# Patient Record
Sex: Female | Born: 1990 | Race: Black or African American | Hispanic: No | Marital: Single | Smoking: Never smoker
Health system: Southern US, Community
[De-identification: ages and names within clinical notes are randomized; demographics above are authoritative.]

## PROBLEM LIST (undated history)

## (undated) DIAGNOSIS — D649 Anemia, unspecified: Secondary | ICD-10-CM

## (undated) DIAGNOSIS — R51 Headache: Secondary | ICD-10-CM

## (undated) DIAGNOSIS — L309 Dermatitis, unspecified: Secondary | ICD-10-CM

## (undated) DIAGNOSIS — R519 Headache, unspecified: Secondary | ICD-10-CM

## (undated) DIAGNOSIS — B999 Unspecified infectious disease: Secondary | ICD-10-CM

## (undated) DIAGNOSIS — F419 Anxiety disorder, unspecified: Secondary | ICD-10-CM

## (undated) HISTORY — PX: MOUTH SURGERY: SHX715

---
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
## 2009-05-29 IMAGING — CT CT PELVIS W/ CM
2 of 5 series · 17 of 46 positions shown, 19 images · IV contrast (agent unspecified)
Comparison: none

CT ABDOMEN

CLINICAL DATA: Abdominal pain, evaluate for appendicitis ovarian
symptoms, right lower quadrant pain  3 weeks

CT ABDOMEN AND PELVIS WITH CONTRAST
TECHNIQUE: Multidetector CT imaging of the abdomen and pelvis was
performed using the standard protocol following bolus
administration of intravenous contrast.
Contrast: 100 ml  Omni 300

[Series 2: abd_pel 5.0 b40s · axial · 0.55mm/px · z∈[-410,-60]mm · 14 of 80 slices shown, 16 images]
[im 5/80  soft-tissue]
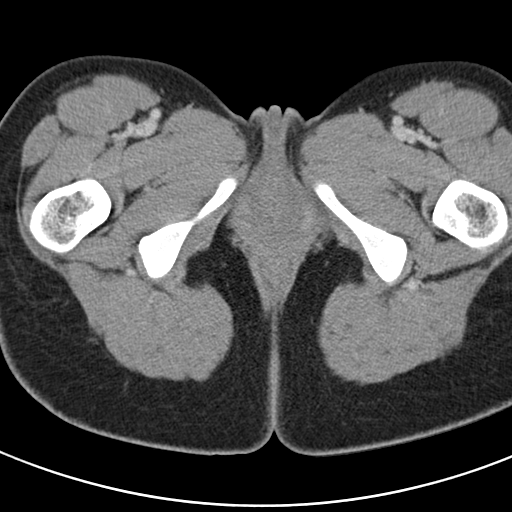
[im 5/80  bone]
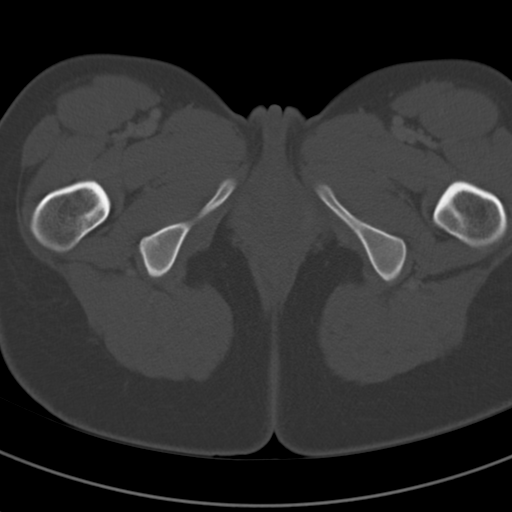
[im 9/80  soft-tissue]
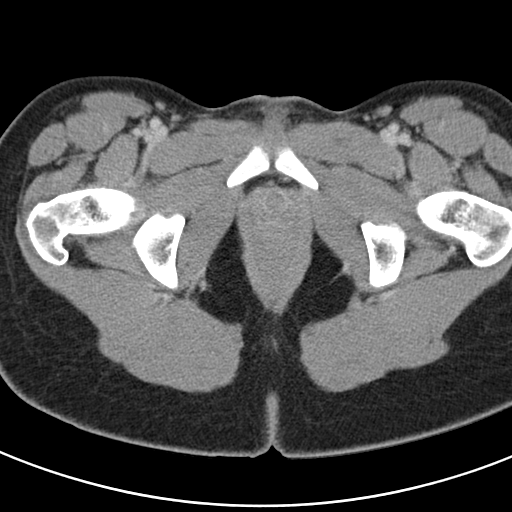
[im 17/80  soft-tissue]
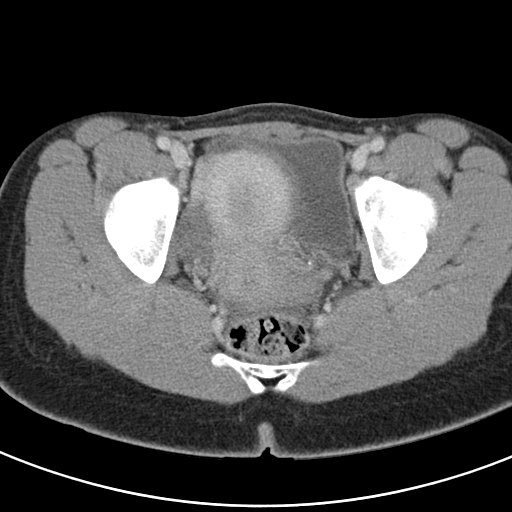
[im 21/80  soft-tissue]
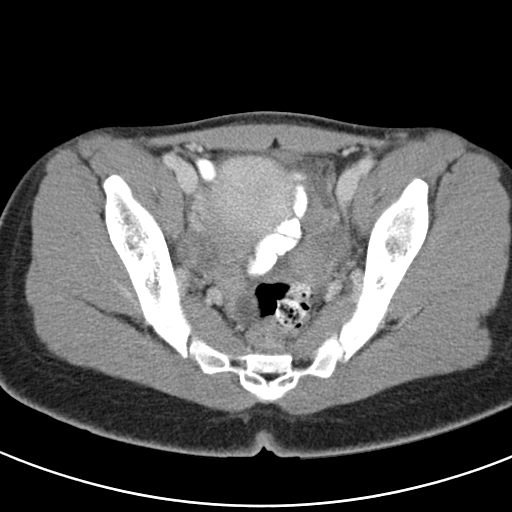
[im 25/80  soft-tissue]
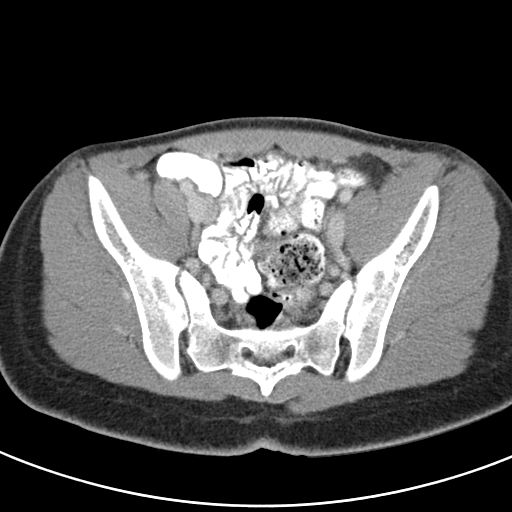
[im 34/80  soft-tissue]
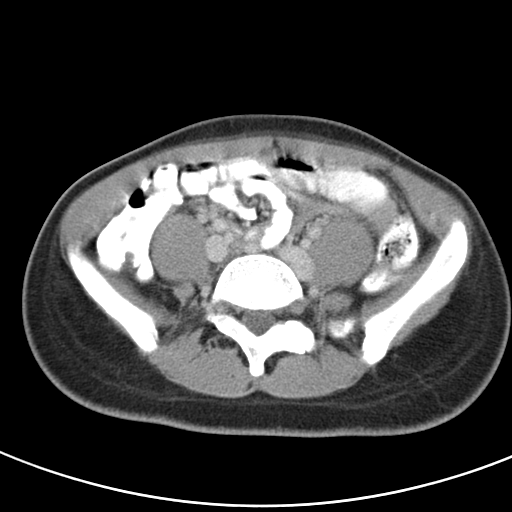
[im 38/80  soft-tissue]
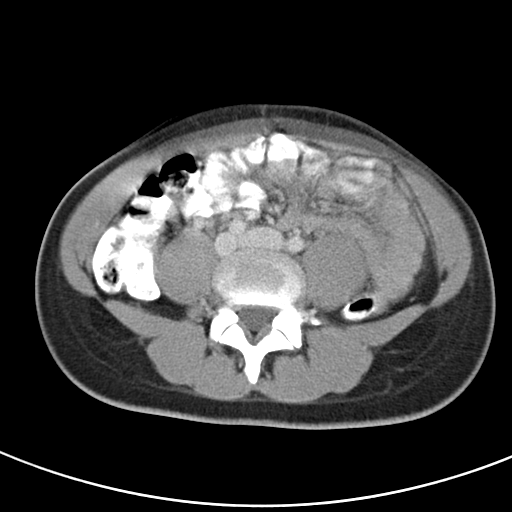
[im 42/80  soft-tissue]
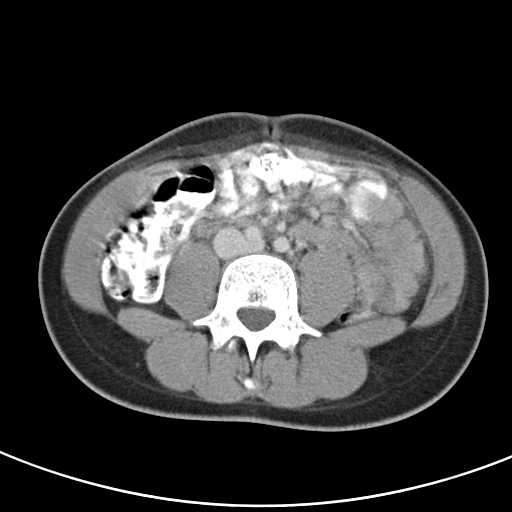
[im 46/80  soft-tissue]
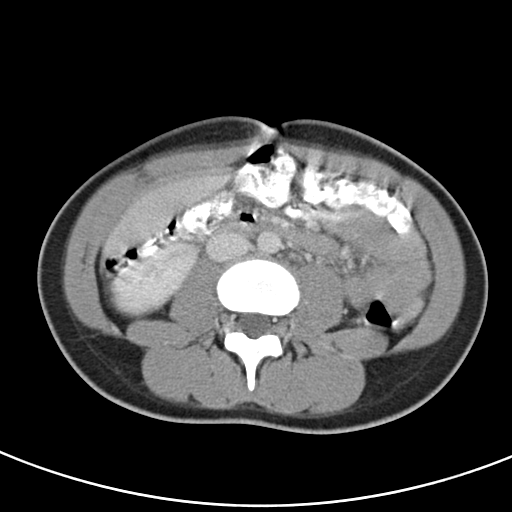
[im 46/80  bone]
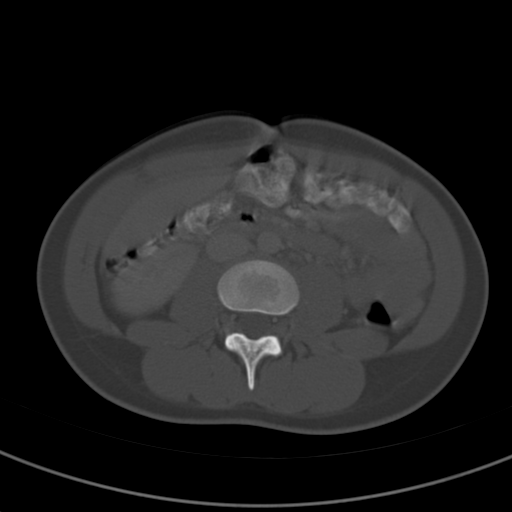
[im 55/80  soft-tissue]
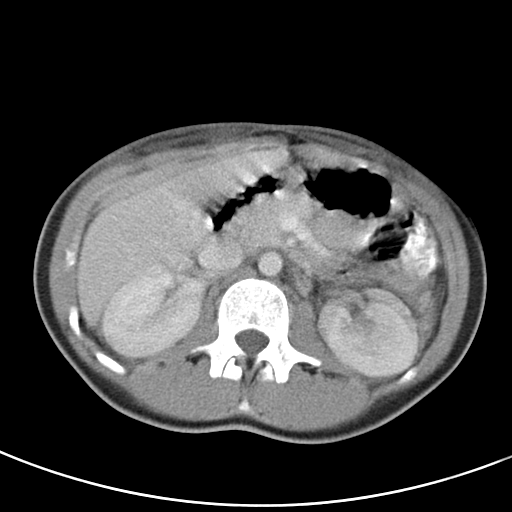
[im 59/80  soft-tissue]
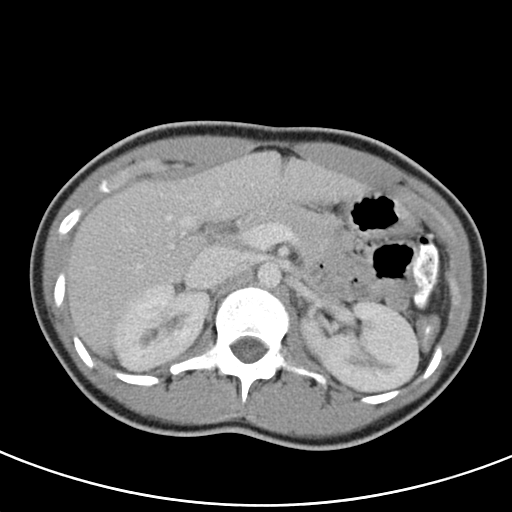
[im 63/80  soft-tissue]
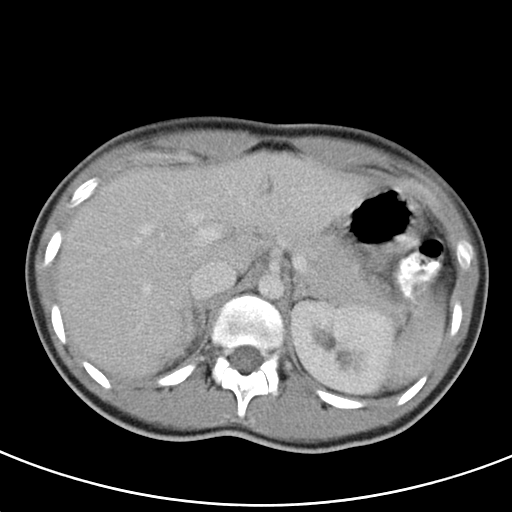
[im 71/80  soft-tissue]
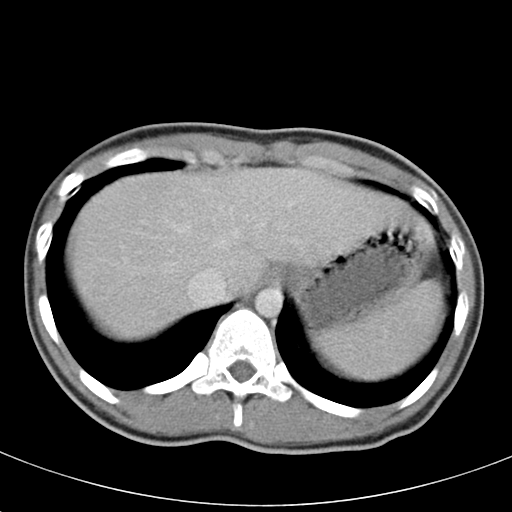
[im 75/80  soft-tissue]
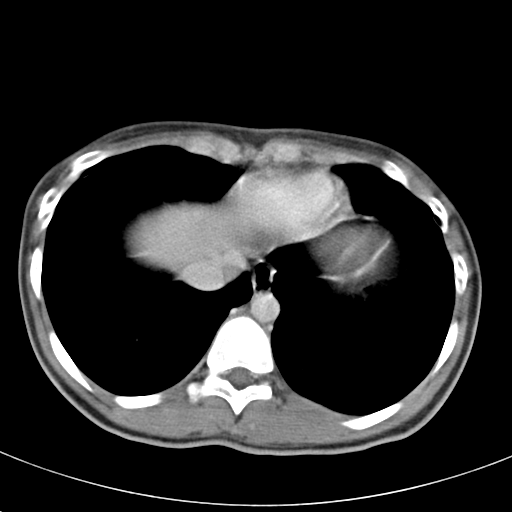

[Series 602: <mpr thick range> · coronal · 0.77mm/px · 3 of 59 slices shown]
[im 20/59  soft-tissue]
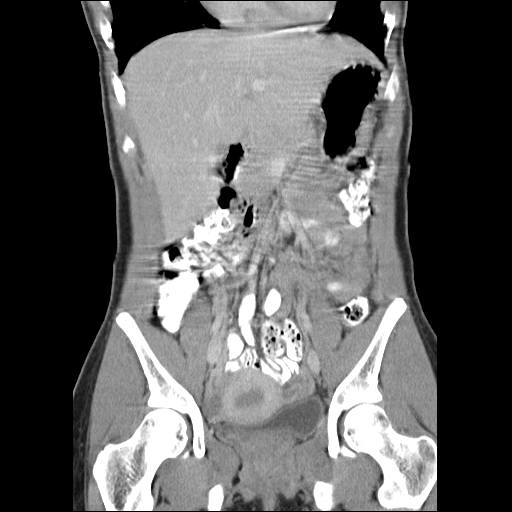
[im 26/59  soft-tissue]
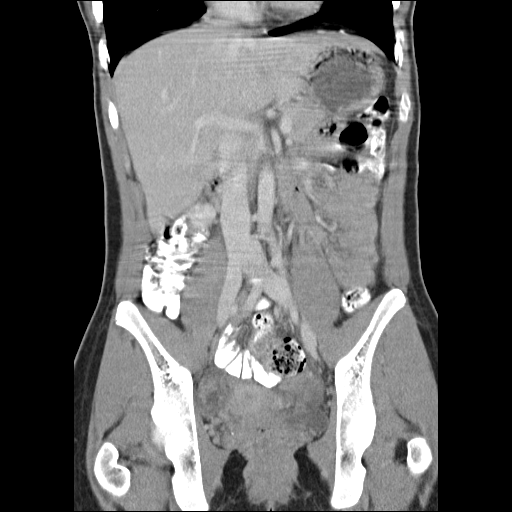
[im 33/59  soft-tissue]
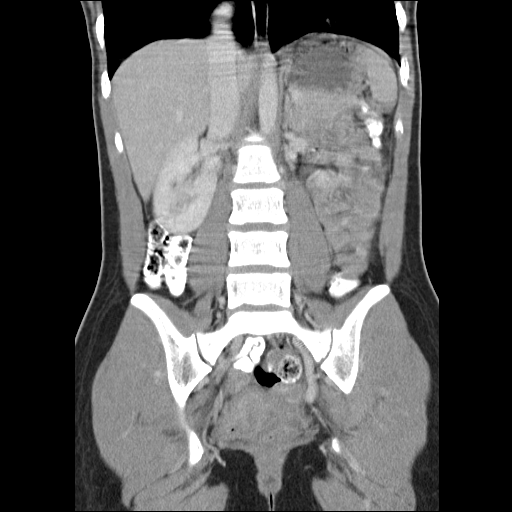

[17 of 46 positions shown; findings below may reference images not displayed]

FINDINGS: Lung bases are clear.  Heart is normal.

Motion artifact on scan.  No focal hepatic lesion.  The gallbladder
is collapsed.  The pancreas, spleen, adrenal glands, and kidneys
appear normal.

The stomach, small bowel, appendix, and colon appear normal.
IMPRESSION: 1..  No acute abdominal process.

CT PELVIS
FINDINGS: There is small amount of dependent free fluid in the
posterior cul-de-sac.  Bladder appears normal.  The endometrium is
thickened/filled with relative low density material.  There is a
ring enhancing structure in the right adnexa measuring approximate
16 mm and likely representing a involuting right ovarian cyst.  The
right ovary measures approximately 28 mm in maximum length.  The
left ovary measures proximally to 3.5  cm.

No evidence of pelvic lymphadenopathy.  No organized fluid
collections.

The rectum and sigmoid colon appear normal.

Review bone windows demonstrates no aggressive osseous lesions.
Unilateral pars defect on the right at S1.
IMPRESSION: 1..  Relative low density material expanding endometrial canal
likely related to menses.
2..  Ring enhancing structure in the right ovary likely
representing involuting ovarian cysts  / follicle.

3.  Unilateral pars defect on the right at S1.  No evidence of
spondylolisthesis.

## 2009-08-23 ENCOUNTER — Emergency Department (HOSPITAL_COMMUNITY): Admission: EM | Admit: 2009-08-23 | Discharge: 2009-08-23 | Payer: Self-pay | Admitting: Emergency Medicine

## 2009-08-25 ENCOUNTER — Emergency Department (HOSPITAL_COMMUNITY): Admission: EM | Admit: 2009-08-25 | Discharge: 2009-08-25 | Payer: Self-pay | Admitting: Emergency Medicine

## 2011-02-22 ENCOUNTER — Inpatient Hospital Stay (INDEPENDENT_AMBULATORY_CARE_PROVIDER_SITE_OTHER)
Admission: RE | Admit: 2011-02-22 | Discharge: 2011-02-22 | Disposition: A | Discharge status: Home / Self Care | Payer: Self-pay | Source: Ambulatory Visit | Attending: Family Medicine | Admitting: Family Medicine

## 2011-02-22 DIAGNOSIS — Z3201 Encounter for pregnancy test, result positive: Secondary | ICD-10-CM

## 2011-02-22 LAB — POCT PREGNANCY, URINE: Preg Test, Ur: POSITIVE

## 2011-04-11 LAB — COMPREHENSIVE METABOLIC PANEL
ALT: 13
AST: 18
CO2: 21
Chloride: 106
GFR calc Af Amer: >60
GFR calc non Af Amer: >60
Sodium: 138
Total Bilirubin: 0.6

## 2011-04-11 LAB — GC/CHLAMYDIA PROBE AMP, GENITAL: GC Probe Amp, Genital: NEGATIVE

## 2011-04-11 LAB — DIFFERENTIAL
Basophils Absolute: 0.1
Basophils Relative: 0
Eosinophils Absolute: 0
Eosinophils Relative: 0

## 2011-04-11 LAB — URINALYSIS, ROUTINE W REFLEX MICROSCOPIC
Bilirubin Urine: NEGATIVE
Hgb urine dipstick: NEGATIVE
Ketones, ur: NEGATIVE
Specific Gravity, Urine: 1.037 — ABNORMAL HIGH
Urobilinogen, UA: 0.2

## 2011-04-11 LAB — URINE MICROSCOPIC-ADD ON

## 2011-04-11 LAB — CBC
MCV: 77 — ABNORMAL LOW
RBC: 4.21
WBC: 18.9 — ABNORMAL HIGH

## 2011-05-01 LAB — URINE CULTURE

## 2011-05-01 LAB — URINALYSIS, ROUTINE W REFLEX MICROSCOPIC
Glucose, UA: NEGATIVE
Protein, ur: NEGATIVE
pH: 6

## 2011-05-01 LAB — URINE MICROSCOPIC-ADD ON

## 2011-09-28 ENCOUNTER — Encounter (HOSPITAL_COMMUNITY): Payer: Self-pay | Admitting: Anesthesiology

## 2011-09-28 ENCOUNTER — Encounter (HOSPITAL_COMMUNITY)
Admission: AD | Disposition: A | Discharge status: Home / Self Care | Payer: Self-pay | Source: Ambulatory Visit | Attending: Obstetrics and Gynecology

## 2011-09-28 ENCOUNTER — Encounter (HOSPITAL_COMMUNITY): Payer: Self-pay | Admitting: *Deleted

## 2011-09-28 ENCOUNTER — Inpatient Hospital Stay (HOSPITAL_COMMUNITY)
Admission: AD | Admit: 2011-09-28 | Discharge: 2011-10-01 | DRG: 766 | Disposition: A | Discharge status: Home / Self Care | Payer: Medicaid Other | Source: Ambulatory Visit | Attending: Obstetrics and Gynecology | Admitting: Obstetrics and Gynecology

## 2011-09-28 ENCOUNTER — Inpatient Hospital Stay (HOSPITAL_COMMUNITY): Payer: Medicaid Other | Admitting: Anesthesiology

## 2011-09-28 DIAGNOSIS — Z22330 Carrier of Group B streptococcus: Secondary | ICD-10-CM

## 2011-09-28 DIAGNOSIS — O99892 Other specified diseases and conditions complicating childbirth: Secondary | ICD-10-CM | POA: Diagnosis present

## 2011-09-28 DIAGNOSIS — Z98891 History of uterine scar from previous surgery: Secondary | ICD-10-CM

## 2011-09-28 HISTORY — DX: Anxiety disorder, unspecified: F41.9

## 2011-09-28 LAB — CBC
HCT: 37.2 % (ref 36.0–46.0)
Hemoglobin: 12.6 g/dL (ref 12.0–15.0)
MCHC: 33.9 g/dL (ref 30.0–36.0)
RBC: 4.28 MIL/uL (ref 3.87–5.11)

## 2011-09-28 LAB — ANTIBODY SCREEN: Antibody Screen: NEGATIVE

## 2011-09-28 LAB — HEPATITIS B SURFACE ANTIGEN: Hepatitis B Surface Ag: NEGATIVE

## 2011-09-28 LAB — RUBELLA ANTIBODY, IGM: Rubella: IMMUNE

## 2011-09-28 LAB — RPR: RPR: NONREACTIVE

## 2011-09-28 LAB — GC/CHLAMYDIA PROBE AMP, GENITAL: Chlamydia: NEGATIVE

## 2011-09-28 SURGERY — Surgical Case
Anesthesia: Epidural | Site: Abdomen | Wound class: Clean Contaminated

## 2011-09-28 MED ORDER — ONDANSETRON HCL 4 MG/2ML IJ SOLN
INTRAMUSCULAR | Status: DC | PRN
  Administered 2011-09-28: 4 mg via INTRAVENOUS

## 2011-09-28 MED ORDER — IBUPROFEN 600 MG PO TABS
600.0000 mg | ORAL_TABLET | Freq: Four times a day (QID) | ORAL | Status: DC
  Administered 2011-09-29 – 2011-10-01 (×8): 600 mg via ORAL
  Filled 2011-09-28 (×8): qty 1

## 2011-09-28 MED ORDER — OXYTOCIN 10 UNIT/ML IJ SOLN
INTRAMUSCULAR | Status: AC
  Filled 2011-09-28: qty 4

## 2011-09-28 MED ORDER — NALBUPHINE HCL 10 MG/ML IJ SOLN
5.0000 mg | INTRAMUSCULAR | Status: DC | PRN
  Filled 2011-09-28: qty 1

## 2011-09-28 MED ORDER — PHENYLEPHRINE 40 MCG/ML (10ML) SYRINGE FOR IV PUSH (FOR BLOOD PRESSURE SUPPORT)
PREFILLED_SYRINGE | INTRAVENOUS | Status: AC
  Filled 2011-09-28: qty 10

## 2011-09-28 MED ORDER — PRENATAL MULTIVITAMIN CH
1.0000 | ORAL_TABLET | Freq: Every day | ORAL | Status: DC
  Administered 2011-09-29 – 2011-10-01 (×3): 1 via ORAL
  Filled 2011-09-28 (×3): qty 1

## 2011-09-28 MED ORDER — LIDOCAINE HCL (PF) 1 % IJ SOLN: 30.0000 mL | INTRAMUSCULAR | Status: DC | PRN

## 2011-09-28 MED ORDER — PHENYLEPHRINE HCL 10 MG/ML IJ SOLN
INTRAMUSCULAR | Status: DC | PRN
  Administered 2011-09-28 (×2): 80 ug via INTRAVENOUS
  Administered 2011-09-28: 40 ug via INTRAVENOUS

## 2011-09-28 MED ORDER — TETANUS-DIPHTH-ACELL PERTUSSIS 5-2.5-18.5 LF-MCG/0.5 IM SUSP: 0.5000 mL | Freq: Once | INTRAMUSCULAR | Status: DC

## 2011-09-28 MED ORDER — ZOLPIDEM TARTRATE 5 MG PO TABS: 5.0000 mg | ORAL_TABLET | Freq: Every evening | ORAL | Status: DC | PRN

## 2011-09-28 MED ORDER — OXYCODONE-ACETAMINOPHEN 5-325 MG PO TABS
1.0000 | ORAL_TABLET | ORAL | Status: DC | PRN
  Administered 2011-09-29 – 2011-10-01 (×5): 1 via ORAL
  Filled 2011-09-28 (×5): qty 1
  Filled 2011-09-28: qty 9

## 2011-09-28 MED ORDER — OXYTOCIN 20 UNITS IN LACTATED RINGERS INFUSION - SIMPLE
1.0000 m[IU]/min | INTRAVENOUS | Status: DC
  Administered 2011-09-28: 1 m[IU]/min via INTRAVENOUS

## 2011-09-28 MED ORDER — DIPHENHYDRAMINE HCL 50 MG/ML IJ SOLN: 12.5000 mg | INTRAMUSCULAR | Status: DC | PRN

## 2011-09-28 MED ORDER — OXYTOCIN BOLUS FROM INFUSION
500.0000 mL | Freq: Once | INTRAVENOUS | Status: DC
  Filled 2011-09-28: qty 500

## 2011-09-28 MED ORDER — CITRIC ACID-SODIUM CITRATE 334-500 MG/5ML PO SOLN
ORAL | Status: AC
  Administered 2011-09-28: 30 mL
  Filled 2011-09-28: qty 15

## 2011-09-28 MED ORDER — OXYTOCIN 20 UNITS IN LACTATED RINGERS INFUSION - SIMPLE
125.0000 mL/h | Freq: Once | INTRAVENOUS | Status: DC
  Filled 2011-09-28: qty 1000

## 2011-09-28 MED ORDER — SCOPOLAMINE 1 MG/3DAYS TD PT72: 1.0000 | MEDICATED_PATCH | Freq: Once | TRANSDERMAL | Status: DC

## 2011-09-28 MED ORDER — LACTATED RINGERS IV SOLN
INTRAVENOUS | Status: DC | PRN
  Administered 2011-09-28 (×2): via INTRAVENOUS

## 2011-09-28 MED ORDER — SIMETHICONE 80 MG PO CHEW
80.0000 mg | CHEWABLE_TABLET | Freq: Three times a day (TID) | ORAL | Status: DC
  Administered 2011-09-29 – 2011-10-01 (×9): 80 mg via ORAL

## 2011-09-28 MED ORDER — PENICILLIN G POTASSIUM 5000000 UNITS IJ SOLR
2.5000 10*6.[IU] | INTRAVENOUS | Status: DC
  Administered 2011-09-28 (×3): 2.5 10*6.[IU] via INTRAVENOUS
  Filled 2011-09-28 (×6): qty 2.5

## 2011-09-28 MED ORDER — KETOROLAC TROMETHAMINE 60 MG/2ML IM SOLN
60.0000 mg | Freq: Once | INTRAMUSCULAR | Status: DC | PRN
  Filled 2011-09-28: qty 2

## 2011-09-28 MED ORDER — SODIUM CHLORIDE 0.9 % IV SOLN
1.0000 ug/kg/h | INTRAVENOUS | Status: DC | PRN
  Filled 2011-09-28: qty 2.5

## 2011-09-28 MED ORDER — LACTATED RINGERS IV SOLN
INTRAVENOUS | Status: DC
  Administered 2011-09-28 (×3): via INTRAVENOUS

## 2011-09-28 MED ORDER — CEFAZOLIN SODIUM 1-5 GM-% IV SOLN
1.0000 g | Freq: Three times a day (TID) | INTRAVENOUS | Status: AC
  Administered 2011-09-29 (×2): 1 g via INTRAVENOUS
  Filled 2011-09-28 (×2): qty 50

## 2011-09-28 MED ORDER — OXYCODONE-ACETAMINOPHEN 5-325 MG PO TABS: 1.0000 | ORAL_TABLET | ORAL | Status: DC | PRN

## 2011-09-28 MED ORDER — BUTORPHANOL TARTRATE 2 MG/ML IJ SOLN
INTRAMUSCULAR | Status: AC
  Filled 2011-09-28: qty 1

## 2011-09-28 MED ORDER — CEFAZOLIN SODIUM 1-5 GM-% IV SOLN
INTRAVENOUS | Status: DC | PRN
  Administered 2011-09-28: 2 g via INTRAVENOUS

## 2011-09-28 MED ORDER — DIPHENHYDRAMINE HCL 25 MG PO CAPS
25.0000 mg | ORAL_CAPSULE | ORAL | Status: DC | PRN
  Filled 2011-09-28: qty 1

## 2011-09-28 MED ORDER — SODIUM BICARBONATE 8.4 % IV SOLN
INTRAVENOUS | Status: DC | PRN
  Administered 2011-09-28: 5 mL via EPIDURAL

## 2011-09-28 MED ORDER — IBUPROFEN 600 MG PO TABS: 600.0000 mg | ORAL_TABLET | Freq: Four times a day (QID) | ORAL | Status: DC | PRN

## 2011-09-28 MED ORDER — BUTORPHANOL TARTRATE 2 MG/ML IJ SOLN
1.0000 mg | Freq: Once | INTRAMUSCULAR | Status: AC
  Administered 2011-09-28: 09:00:00 via INTRAVENOUS
  Filled 2011-09-28: qty 1

## 2011-09-28 MED ORDER — CEFAZOLIN SODIUM-DEXTROSE 2-3 GM-% IV SOLR
2.0000 g | Freq: Once | INTRAVENOUS | Status: DC
  Filled 2011-09-28: qty 50

## 2011-09-28 MED ORDER — MEPERIDINE HCL 25 MG/ML IJ SOLN
INTRAMUSCULAR | Status: DC | PRN
  Administered 2011-09-28: 25 mg via INTRAVENOUS

## 2011-09-28 MED ORDER — PHENYLEPHRINE 40 MCG/ML (10ML) SYRINGE FOR IV PUSH (FOR BLOOD PRESSURE SUPPORT): 80.0000 ug | PREFILLED_SYRINGE | INTRAVENOUS | Status: DC | PRN

## 2011-09-28 MED ORDER — ONDANSETRON HCL 4 MG PO TABS: 4.0000 mg | ORAL_TABLET | ORAL | Status: DC | PRN

## 2011-09-28 MED ORDER — MORPHINE SULFATE (PF) 0.5 MG/ML IJ SOLN
INTRAMUSCULAR | Status: DC | PRN
  Administered 2011-09-28: 1 mg via INTRAVENOUS

## 2011-09-28 MED ORDER — MORPHINE SULFATE (PF) 0.5 MG/ML IJ SOLN
INTRAMUSCULAR | Status: DC | PRN
  Administered 2011-09-28: 4 mg via EPIDURAL

## 2011-09-28 MED ORDER — LIDOCAINE HCL (PF) 1 % IJ SOLN
INTRAMUSCULAR | Status: DC | PRN
  Administered 2011-09-28 (×3): 4 mL

## 2011-09-28 MED ORDER — MENTHOL 3 MG MT LOZG: 1.0000 | LOZENGE | OROMUCOSAL | Status: DC | PRN

## 2011-09-28 MED ORDER — DIPHENHYDRAMINE HCL 25 MG PO CAPS: 25.0000 mg | ORAL_CAPSULE | Freq: Four times a day (QID) | ORAL | Status: DC | PRN

## 2011-09-28 MED ORDER — SODIUM CHLORIDE 0.9 % IR SOLN
Status: DC | PRN
  Administered 2011-09-28: 1000 mL

## 2011-09-28 MED ORDER — FENTANYL CITRATE 0.05 MG/ML IJ SOLN
INTRAMUSCULAR | Status: AC
  Filled 2011-09-28: qty 2

## 2011-09-28 MED ORDER — BUTORPHANOL TARTRATE 2 MG/ML IJ SOLN
1.0000 mg | Freq: Once | INTRAMUSCULAR | Status: AC
  Administered 2011-09-28: 1 mg via INTRAVENOUS

## 2011-09-28 MED ORDER — OXYTOCIN 10 UNIT/ML IJ SOLN
20.0000 [IU] | INTRAVENOUS | Status: DC | PRN
  Administered 2011-09-28: 20 [IU] via INTRAVENOUS

## 2011-09-28 MED ORDER — PENICILLIN G POTASSIUM 5000000 UNITS IJ SOLR
5.0000 10*6.[IU] | Freq: Once | INTRAVENOUS | Status: AC
  Administered 2011-09-28: 5 10*6.[IU] via INTRAVENOUS
  Filled 2011-09-28: qty 5

## 2011-09-28 MED ORDER — OXYTOCIN 20 UNITS IN LACTATED RINGERS INFUSION - SIMPLE
INTRAVENOUS | Status: AC
  Administered 2011-09-28: 20 [IU]
  Filled 2011-09-28: qty 1000

## 2011-09-28 MED ORDER — FENTANYL CITRATE 0.05 MG/ML IJ SOLN: 25.0000 ug | INTRAMUSCULAR | Status: DC | PRN

## 2011-09-28 MED ORDER — PHENYLEPHRINE 40 MCG/ML (10ML) SYRINGE FOR IV PUSH (FOR BLOOD PRESSURE SUPPORT)
80.0000 ug | PREFILLED_SYRINGE | INTRAVENOUS | Status: DC | PRN
  Filled 2011-09-28: qty 5

## 2011-09-28 MED ORDER — DIPHENHYDRAMINE HCL 50 MG/ML IJ SOLN: 25.0000 mg | INTRAMUSCULAR | Status: DC | PRN

## 2011-09-28 MED ORDER — MEASLES, MUMPS & RUBELLA VAC ~~LOC~~ INJ
0.5000 mL | INJECTION | Freq: Once | SUBCUTANEOUS | Status: DC
Start: 2011-09-29 — End: 2011-09-29
  Filled 2011-09-28: qty 0.5

## 2011-09-28 MED ORDER — LACTATED RINGERS IV SOLN: 500.0000 mL | Freq: Once | INTRAVENOUS | Status: DC

## 2011-09-28 MED ORDER — NALOXONE HCL 0.4 MG/ML IJ SOLN: 0.4000 mg | INTRAMUSCULAR | Status: DC | PRN

## 2011-09-28 MED ORDER — EPHEDRINE 5 MG/ML INJ
10.0000 mg | INTRAVENOUS | Status: DC | PRN
  Filled 2011-09-28: qty 4

## 2011-09-28 MED ORDER — SENNOSIDES-DOCUSATE SODIUM 8.6-50 MG PO TABS
2.0000 | ORAL_TABLET | Freq: Every day | ORAL | Status: DC
  Administered 2011-09-29: 2 via ORAL

## 2011-09-28 MED ORDER — ONDANSETRON HCL 4 MG/2ML IJ SOLN: 4.0000 mg | Freq: Three times a day (TID) | INTRAMUSCULAR | Status: DC | PRN

## 2011-09-28 MED ORDER — KETOROLAC TROMETHAMINE 30 MG/ML IJ SOLN: 30.0000 mg | Freq: Four times a day (QID) | INTRAMUSCULAR | Status: DC | PRN

## 2011-09-28 MED ORDER — OXYTOCIN 20 UNITS IN LACTATED RINGERS INFUSION - SIMPLE: 125.0000 mL/h | INTRAVENOUS | Status: AC

## 2011-09-28 MED ORDER — ONDANSETRON HCL 4 MG/2ML IJ SOLN: 4.0000 mg | INTRAMUSCULAR | Status: DC | PRN

## 2011-09-28 MED ORDER — MEPERIDINE HCL 25 MG/ML IJ SOLN: 6.2500 mg | INTRAMUSCULAR | Status: DC | PRN

## 2011-09-28 MED ORDER — FENTANYL CITRATE 0.05 MG/ML IJ SOLN
INTRAMUSCULAR | Status: DC | PRN
  Administered 2011-09-28 (×2): 50 ug via INTRAVENOUS

## 2011-09-28 MED ORDER — ONDANSETRON HCL 4 MG/2ML IJ SOLN
INTRAMUSCULAR | Status: AC
  Filled 2011-09-28: qty 2

## 2011-09-28 MED ORDER — DIBUCAINE 1 % RE OINT: 1.0000 "application " | TOPICAL_OINTMENT | RECTAL | Status: DC | PRN

## 2011-09-28 MED ORDER — SIMETHICONE 80 MG PO CHEW: 80.0000 mg | CHEWABLE_TABLET | ORAL | Status: DC | PRN

## 2011-09-28 MED ORDER — MEPERIDINE HCL 25 MG/ML IJ SOLN
INTRAMUSCULAR | Status: AC
  Filled 2011-09-28: qty 1

## 2011-09-28 MED ORDER — EPHEDRINE 5 MG/ML INJ: 10.0000 mg | INTRAVENOUS | Status: DC | PRN

## 2011-09-28 MED ORDER — WITCH HAZEL-GLYCERIN EX PADS: 1.0000 "application " | MEDICATED_PAD | CUTANEOUS | Status: DC | PRN

## 2011-09-28 MED ORDER — SODIUM CHLORIDE 0.9 % IJ SOLN: 3.0000 mL | INTRAMUSCULAR | Status: DC | PRN

## 2011-09-28 MED ORDER — FENTANYL 2.5 MCG/ML BUPIVACAINE 1/10 % EPIDURAL INFUSION (WH - ANES)
14.0000 mL/h | INTRAMUSCULAR | Status: DC
  Administered 2011-09-28 (×2): 14 mL/h via EPIDURAL
  Filled 2011-09-28 (×2): qty 60

## 2011-09-28 MED ORDER — LANOLIN HYDROUS EX OINT: 1.0000 "application " | TOPICAL_OINTMENT | CUTANEOUS | Status: DC | PRN

## 2011-09-28 MED ORDER — KETOROLAC TROMETHAMINE 30 MG/ML IJ SOLN
30.0000 mg | Freq: Four times a day (QID) | INTRAMUSCULAR | Status: AC | PRN
  Administered 2011-09-28: 30 mg via INTRAVENOUS
  Filled 2011-09-28: qty 1

## 2011-09-28 MED ORDER — METOCLOPRAMIDE HCL 5 MG/ML IJ SOLN: 10.0000 mg | Freq: Three times a day (TID) | INTRAMUSCULAR | Status: DC | PRN

## 2011-09-28 MED ORDER — LACTATED RINGERS IV SOLN
500.0000 mL | INTRAVENOUS | Status: DC | PRN
  Administered 2011-09-28 (×2): 1000 mL via INTRAVENOUS

## 2011-09-28 MED ORDER — MORPHINE SULFATE 0.5 MG/ML IJ SOLN
INTRAMUSCULAR | Status: AC
  Filled 2011-09-28: qty 10

## 2011-09-28 SURGICAL SUPPLY — 31 items
CLOTH BEACON ORANGE TIMEOUT ST (SAFETY) ×2 IMPLANT
CONTAINER PREFILL 10% NBF 15ML (MISCELLANEOUS) IMPLANT
DRESSING TELFA 8X3 (GAUZE/BANDAGES/DRESSINGS) IMPLANT
DRSG VASELINE 3X18 (GAUZE/BANDAGES/DRESSINGS) ×2 IMPLANT
ELECT REM PT RETURN 9FT ADLT (ELECTROSURGICAL) ×2
ELECTRODE REM PT RTRN 9FT ADLT (ELECTROSURGICAL) ×1 IMPLANT
EXTRACTOR VACUUM KIWI (MISCELLANEOUS) IMPLANT
EXTRACTOR VACUUM M CUP 4 TUBE (SUCTIONS) IMPLANT
GAUZE SPONGE 4X4 12PLY STRL LF (GAUZE/BANDAGES/DRESSINGS) ×2 IMPLANT
GLOVE BIO SURGEON STRL SZ7.5 (GLOVE) ×4 IMPLANT
GOWN PREVENTION PLUS LG XLONG (DISPOSABLE) ×4 IMPLANT
GOWN PREVENTION PLUS XLARGE (GOWN DISPOSABLE) ×2 IMPLANT
KIT ABG SYR 3ML LUER SLIP (SYRINGE) IMPLANT
NEEDLE HYPO 25X5/8 SAFETYGLIDE (NEEDLE) IMPLANT
NS IRRIG 1000ML POUR BTL (IV SOLUTION) ×2 IMPLANT
PACK C SECTION WH (CUSTOM PROCEDURE TRAY) ×2 IMPLANT
PAD ABD 7.5X8 STRL (GAUZE/BANDAGES/DRESSINGS) IMPLANT
RTRCTR C-SECT PINK 25CM LRG (MISCELLANEOUS) IMPLANT
SLEEVE SCD COMPRESS KNEE MED (MISCELLANEOUS) IMPLANT
STAPLER VISISTAT 35W (STAPLE) ×2 IMPLANT
SUT PLAIN 0 NONE (SUTURE) IMPLANT
SUT VIC AB 0 CT1 36 (SUTURE) ×10 IMPLANT
SUT VIC AB 3-0 CTX 36 (SUTURE) ×2 IMPLANT
SUT VIC AB 3-0 SH 27 (SUTURE)
SUT VIC AB 3-0 SH 27X BRD (SUTURE) IMPLANT
SUT VIC AB 4-0 KS 27 (SUTURE) IMPLANT
SUT VICRYL 0 TIES 12 18 (SUTURE) IMPLANT
TAPE CLOTH SURG 4X10 WHT LF (GAUZE/BANDAGES/DRESSINGS) ×2 IMPLANT
TOWEL OR 17X24 6PK STRL BLUE (TOWEL DISPOSABLE) ×4 IMPLANT
TRAY FOLEY CATH 14FR (SET/KITS/TRAYS/PACK) IMPLANT
WATER STERILE IRR 1000ML POUR (IV SOLUTION) ×2 IMPLANT

## 2011-09-28 NOTE — Progress Notes (Signed)
Patient ID: Shelley Merritt, female   DOB: 1991-04-01, 21 y.o.   MRN: [id number] The pt tolerates labor poorly. She is on 20 mu/ minute of pitocin and the contractions are q 2-4and 1/2 minutes Cervix is 3-4 cm 70% effaced and the vertex is at -2 station. Will proceed with epidural.

## 2011-09-28 NOTE — Transfer of Care (Signed)
Immediate Anesthesia Transfer of Care Note  Patient: Shelley Merritt  Procedure(s) Performed: Procedure(s) (LRB): CESAREAN SECTION (N/A)  Patient Location: PACU  Anesthesia Type: Epidural  Level of Consciousness: awake, alert  and oriented  Airway & Oxygen Therapy: Patient Spontanous Breathing  Post-op Assessment: Report given to PACU RN and Post -op Vital signs reviewed and stable  Post vital signs: stable  Complications: No apparent anesthesia complications

## 2011-09-28 NOTE — Consult Note (Addendum)
Requested to attend primary C/S at term gestation for failure to dilate after trial of labor induction. No risk factors except + maternal GBS but she has received adequate penicillin prophylaxis.  Mother also has anxiety syndrome.  At birth infant in vertex with spontaneous cries and good tone following delivery. Given tactile stimulation with drying and bulb suction of naso/oropharynx yielding minimal fluid. There are no dysmorphic features. Apgar scores 9/10 at one and five minutes.    Care deferred to RN in OR and to assigned pediatrician.   Dagoberto Ligas MD [facility]

## 2011-09-28 NOTE — MAU Note (Signed)
Pt walked into MAU lobby and very excited and anxious. Pants obviously wet and states her water broke. Pt has anxiety and very anxious. Helped to w/c and then to triage. Helped her to breathe slowly and relax and calm down. Triage done and pt helped to BR and put on dry clothes. Had to remind pt to stay calm and relax.

## 2011-09-28 NOTE — MAU Note (Signed)
Report called to [facility] in [place]. Pt to 169 via w/c.

## 2011-09-28 NOTE — Plan of Care (Signed)
Problem: Consults Goal: Birthing Suites Patient Information Press F2 to bring up selections list   Pt 37-[redacted] weeks EGA     

## 2011-09-28 NOTE — Op Note (Signed)
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 Rodman Pickle  There was hematuria present before the patient left her labor room. She was transported to the operating room and anesthesia was confirmed by Dr. Rodman Pickle. The abdomen was prepped with Betadine solution, a timeout was done, and the abdomen was draped as a sterile field. I pinched the lower abdomen with an Allis clamp and the patient had no pain. A transverse incision was made suprapubically and carried in layers through the skin subcutaneous tissue and fascia. The fascia was separated from the rectus muscle superiorly and inferiorly. The rectus muscle was split in the midline and the peritoneum was opened vertically. The lower uterine segment was exposed and a transverse incision was made through the superficial layers of the myometrium. I went into the amniotic sac with my finger and enlarged the incision by pulling superiorly and inferiorly. A large loop of cord popped out through the incision. I elevated the vertex into the uterine incision and with fundal pressure delivered the baby. The cord was clamped and the infant was given to Dr. Rennis Golden  who was in attendance. He assigned the female infant Apgars of 9 and 10 at one and 5 minutes. The placenta was removed intact and the inside of the uterus was free of any products of conception. The uterus both tubes and ovaries were normal. The uterine incision was closed with 2 running sutures of 0 Vicryl locking the first layer nonlocking the second layer Liberal irrigation confirmed hemostasis. A bleeder at the top of the peritoneal incision going in was controlled at this point. The abdominal wall was closed in layers using interrupted sutures of 0 Vicryl to reapproximate the rectus muscle and peritoneum in one  layer, 2 running sutures of 0 Vicryl on the fascia, a running 3-0 Vicryl on the subcutaneous tissue, and staples on the skin. The patient tolerated the procedure but she did have discomfort during the procedure. She is a very anxious individual and was returned to recovery in satisfactory condition sponge and needle counts were correct

## 2011-09-28 NOTE — Anesthesia Procedure Notes (Signed)
Epidural Patient location during procedure: OB Start time: 09/28/2011 1:27 PM Reason for block: procedure for pain  Staffing Performed by: anesthesiologist   Preanesthetic Checklist Completed: patient identified, site marked, surgical consent, pre-op evaluation, timeout performed, IV checked, risks and benefits discussed and monitors and equipment checked  Epidural Patient position: sitting Prep: site prepped and draped and DuraPrep Patient monitoring: continuous pulse ox and blood pressure Approach: midline Injection technique: LOR air  Needle:  Needle type: Tuohy  Needle gauge: 17 G Needle length: 9 cm Needle insertion depth: 5 cm cm Catheter type: closed end flexible Catheter size: 19 Gauge Catheter at skin depth: 10 cm Test dose: negative  Assessment Events: blood not aspirated, injection not painful, no injection resistance, negative IV test and no paresthesia  Additional Notes Discussed risk of headache, infection, bleeding, nerve injury and failed or incomplete block.  Patient voices understanding and wishes to proceed.

## 2011-09-28 NOTE — Progress Notes (Signed)
Patient ID: Shelley Merritt, female   DOB: Apr 16, 1991, 21 y.o.   MRN: [id number] Pt is on 26 mu/minute of pitocin and contractions are q 2 minutes Cervix is 4 cm 70% effaced and the vertex is at -1 station I will proceed with Csection for FTP

## 2011-09-28 NOTE — H&P (Signed)
Shelley, Merritt              ACCOUNT NO.:  [id number]  MEDICAL RECORD NO.:  [id number]  LOCATION:  9169                          FACILITY:  [facility]  PHYSICIAN:  Malachi Pro. Ambrose Mantle, M.D. DATE OF BIRTH:  Jun 09, 1991  DATE OF ADMISSION:  09/28/2011 DATE OF DISCHARGE:                             HISTORY & PHYSICAL   HISTORY OF PRESENT ILLNESS:  This is a 21 year old black female, para 0, gravida 1, EDC, October 05, 2011, admitted with ruptured membranes.  The patient states that she was drinking lemonade at approximately 1:30 a.m. on September 28, 2011, and began leaking fluid through her vagina.  She continued to leak, came here for evaluation.  Blood group and type A positive with a negative antibody.  Pap smear was normal.  Rubella immune, RPR nonreactive.  Urine culture negative.  Hepatitis B surface antigen negative, HIV negative, GC and Chlamydia negative.  Hemoglobin electrophoresis AA, cystic fibrosis negative.  First trimester screen negative.  AFP negative.  A one-hour Glucola 178.  Group B strep positive.  The patient had a relatively uncomplicated prenatal course, and is admitted now with ruptured membranes.  PAST MEDICAL HISTORY:  Reveals no known drug allergies.  She states she is LATEX allergic.  She has had illnesses including anxiety, Chlamydia, and panic attacks.  She has had no surgeries.  FAMILY HISTORY:  Brother has asthma and aunt has diabetes, a cousin has a seizure disorder.  The patient has a 10th grade education.  She denies alcohol, tobacco, and substance abuse.  She has had no prior pregnancies.  Ultrasound at 8 weeks and 6 days gestation, placed her [facility] is October 05, 2011.  Follow up ultrasounds were compatible with dates.  PHYSICAL EXAMINATION:  VITAL SIGNS:  On admission, temperature 98, pulse 129, respirations 22, blood pressure 113/87. GENERAL:  The patient is an extremely anxious black female.  She seems to have very little maturity. HEART:  Normal size and  sounds.  No murmurs. LUNGS:  Clear to auscultation. ABDOMEN:  Soft.  Fundal height at her last recorded prenatal visit on September 23, 2011, was 38 cm.  Fetal heart tones are normal.  Cervix appears to be unchanged from the 1 cm, 50%, and -2 station.  Dr. Senaida Ores, gave her on September 23, 2011.  Fern test is positive.  ADMITTING IMPRESSION:  Intrauterine pregnancy at 39 weeks, premature rupture of the membranes, positive group B strep.  The patient is admitted, will be treated with penicillin and if labor does not ensue, she will begin on Pitocin.    Malachi Pro. Ambrose Mantle, M.D.    TFH/MEDQ  D:  09/28/2011  T:  09/28/2011  Job:  [id number]

## 2011-09-28 NOTE — OR Nursing (Signed)
Uterus massaged by D. Val Eagle RN./Two tubes of cord blood to lab. Foley catheter in place upon arrival to OR.

## 2011-09-28 NOTE — Anesthesia Preprocedure Evaluation (Addendum)
Anesthesia Evaluation  Patient identified by MRN, date of birth, ID band Patient awake    Reviewed: Allergy & Precautions, H&P , NPO status , Patient's Chart, lab work & pertinent test results, reviewed documented beta blocker date and time   History of Anesthesia Complications Negative for: history of anesthetic complications  Airway Mallampati: III TM Distance: >3 FB Neck ROM: full    Dental  (+) Teeth Intact   Pulmonary neg pulmonary ROS,  breath sounds clear to auscultation        Cardiovascular negative cardio ROS  Rhythm:regular Rate:Normal     Neuro/Psych PSYCHIATRIC DISORDERS (anxiety) negative neurological ROS     GI/Hepatic negative GI ROS, Neg liver ROS,   Endo/Other  negative endocrine ROS  Renal/GU negative Renal ROS  negative genitourinary   Musculoskeletal   Abdominal   Peds  Hematology negative hematology ROS (+)   Anesthesia Other Findings   Reproductive/Obstetrics (+) Pregnancy                           Anesthesia Physical Anesthesia Plan  ASA: II  Anesthesia Plan: Epidural   Post-op Pain Management:    Induction:   Airway Management Planned:   Additional Equipment:   Intra-op Plan:   Post-operative Plan:   Informed Consent: I have reviewed the patients History and Physical, chart, labs and discussed the procedure including the risks, benefits and alternatives for the proposed anesthesia with the patient or authorized representative who has indicated his/her understanding and acceptance.     Plan Discussed with:   Anesthesia Plan Comments:         Anesthesia Quick Evaluation

## 2011-09-28 NOTE — Progress Notes (Signed)
Pt extremely anxious.  Reassured pt with information on IV pain medication, epidurals and the process of labor.  Is not sure about IV pain medication at this time.  Will hold until pt requests.

## 2011-09-28 NOTE — Anesthesia Postprocedure Evaluation (Signed)
Anesthesia Post Note  Patient: Shelley Merritt  Procedure(s) Performed: Procedure(s) (LRB): CESAREAN SECTION (N/A)  Anesthesia type: Epidural  Patient location: PACU  Post pain: Pain level controlled  Post assessment: Post-op Vital signs reviewed  Last Vitals:  Filed Vitals:   09/28/11 2000  BP: 113/66  Pulse: 80  Temp:   Resp: 19    Post vital signs: stable  Level of consciousness: awake  Complications: No apparent anesthesia complications

## 2011-09-28 NOTE — MAU Note (Signed)
Pt states "I was dancing and I had a contraction. Went to Br and peed. Water kept coming out"

## 2011-09-29 ENCOUNTER — Encounter (HOSPITAL_COMMUNITY): Payer: Self-pay | Admitting: *Deleted

## 2011-09-29 LAB — CBC
HCT: 31.1 % — ABNORMAL LOW (ref 36.0–46.0)
Hemoglobin: 10.2 g/dL — ABNORMAL LOW (ref 12.0–15.0)
MCH: 28.9 pg (ref 26.0–34.0)
MCHC: 32.8 g/dL (ref 30.0–36.0)
RBC: 3.53 MIL/uL — ABNORMAL LOW (ref 3.87–5.11)

## 2011-09-29 NOTE — Progress Notes (Signed)
Patient ID: Shelley Merritt, female   DOB: 02-Aug-1990, 21 y.o.   MRN: [id number] #1 afebrile BP normal no complaints HGB stable

## 2011-09-29 NOTE — Addendum Note (Signed)
Addendum  created 09/29/11 1916 by Len Blalock, CRNA   Modules edited:Notes Section

## 2011-09-29 NOTE — Anesthesia Postprocedure Evaluation (Signed)
  Anesthesia Post-op Note  Patient: Shelley Merritt  Procedure(s) Performed: Procedure(s) (LRB): CESAREAN SECTION (N/A)  Patient Location: PACU and Mother/Baby  Anesthesia Type: Epidural  Level of Consciousness: awake, alert  and oriented  Airway and Oxygen Therapy: Patient Spontanous Breathing   Post-op Assessment: Patient's Cardiovascular Status Stable and Respiratory Function Stable  Post-op Vital Signs: stable  Complications: No apparent anesthesia complications

## 2011-09-30 NOTE — Progress Notes (Signed)
Patient ID: Shelley Merritt, female   DOB: 02-11-1991, 21 y.o.   MRN: [id number] #2 afebrile BP normal Tolerating a diet and passing flatus. Voiding and ambulating well.

## 2011-09-30 NOTE — Progress Notes (Signed)
UR chart review completed.  

## 2011-10-01 MED ORDER — OXYCODONE-ACETAMINOPHEN 5-325 MG PO TABS: 1.0000 | ORAL_TABLET | Freq: Three times a day (TID) | ORAL | Status: AC | PRN

## 2011-10-01 MED ORDER — IBUPROFEN 600 MG PO TABS: 600.0000 mg | ORAL_TABLET | Freq: Four times a day (QID) | ORAL | Status: AC | PRN

## 2011-10-01 NOTE — Discharge Summary (Signed)
NAMEBRISTYL, Merritt              ACCOUNT NO.:  [id number]  MEDICAL RECORD NO.:  [id number]  LOCATION:  9132                          FACILITY:  [facility]  PHYSICIAN:  Malachi Pro. Ambrose Mantle, M.D. DATE OF BIRTH:  Mar 14, 1991  DATE OF ADMISSION:  09/28/2011 DATE OF DISCHARGE:  10/01/2011                              DISCHARGE SUMMARY   This is a 21 year old black female, para 0 gravida 1, EDC October 05, 2011, admitted with ruptured membranes.  She stated she was drinking lemonade at approximately 1:30 a.m. on September 28, 2011, and began leaking fluid through her vagina.  She came to the hospital and rupture of membranes was confirmed.  Blood group and type A positive, negative antibody, Pap smear normal, rubella immune. RPR nonreactive, urine culture negative, hepatitis B surface antigen negative.  HIV, GC, and Chlamydia negative.  Hemoglobin electrophoresis AA.  Cystic fibrosis negative.  First trimester screen negative.  AFP negative.  One-hour Glucola was 78.  Group B strep positive.  The patient was admitted to the hospital.  She was placed on penicillin.  She had a strange affect, very anxious.  Nurses commented on how strange she seemed.  She tolerated labor poorly.  By 12:42 p.m., she was on 20 mU/min of Pitocin and contractions every 2-4 minutes.  Cervix was 3-4 cm, 70% effaced, vertex at a -2 station.  She was given an epidural.  By 6:32 p.m., she had been raised to 26 mU/min of Pitocin.  Contractions every 2 minutes. Cervix was 4 cm, 70%.  There had been no progress in labor and she was taken to the operating room for C-section.  She underwent a low- transverse cervical C-section by Dr. Ambrose Mantle with delivery of a female infant, Apgars of 9 and 10 at 1 and 5 minutes.  Procedure was unremarkable.  Postpartum, the patient did quite well and was ready for discharge on the 3rd postpartum day.  Initial hemoglobin 12.6, hematocrit 37.2, white count 12,900, platelet count 198,000.   Followup hemoglobin 10.2.  RPR was nonreactive.  FINAL DIAGNOSES:  Intrauterine pregnancy at 39+ weeks, delivered by cesarean section, failure to progress in labor.  OPERATION:  Low-transverse cervical C-section.  FINAL CONDITION:  Improved.  INSTRUCTIONS:  Include our regular discharge instruction booklet.  The patient is advised to return to the office in 2 days to have her staples removed.  She is given the after visit summary, prescriptions for Percocet 5/325, #10 tablets, 1 every 8 hours as needed for pain and Motrin 600 mg, #30 tablets, 1 every 6 hours as needed for pain.     Malachi Pro. Ambrose Mantle, M.D.     TFH/MEDQ  D:  10/01/2011  T:  10/01/2011  Job:  [id number]

## 2011-10-01 NOTE — Progress Notes (Signed)
Patient ID: Shelley Merritt, female   DOB: 1990-10-02, 21 y.o.   MRN: [id number] #[id number] afebrile BP normal for D/C.

## 2011-10-01 NOTE — Discharge Instructions (Signed)
booklet °

## 2011-10-01 NOTE — Progress Notes (Signed)
Patient was referred for history of depression/anxiety. * Referral screened out by Clinical Social Worker because none of the following criteria appear to apply:  ~ History of anxiety/depression during this pregnancy, or of post-partum depression.  ~ Diagnosis of anxiety and/or depression within last 3 years  ~ History of depression due to pregnancy loss/loss of child  OR * Patient's symptoms currently being treated with medication and/or therapy.  Please contact the Clinical Social Worker if needs arise, or by the patient's request. Pt's symptoms have not been treated "in years," and under control, as per pt.

## 2011-10-02 ENCOUNTER — Encounter (HOSPITAL_COMMUNITY): Payer: Self-pay | Admitting: Obstetrics and Gynecology

## 2012-01-28 ENCOUNTER — Encounter (HOSPITAL_COMMUNITY): Payer: Self-pay | Admitting: Emergency Medicine

## 2012-01-28 ENCOUNTER — Emergency Department (HOSPITAL_COMMUNITY)
Admission: EM | Admit: 2012-01-28 | Discharge: 2012-01-29 | Disposition: A | Discharge status: Home / Self Care | Payer: Medicaid Other | Attending: Emergency Medicine | Admitting: Emergency Medicine

## 2012-01-28 DIAGNOSIS — N760 Acute vaginitis: Secondary | ICD-10-CM

## 2012-01-28 DIAGNOSIS — N898 Other specified noninflammatory disorders of vagina: Secondary | ICD-10-CM | POA: Insufficient documentation

## 2012-01-28 DIAGNOSIS — F411 Generalized anxiety disorder: Secondary | ICD-10-CM | POA: Insufficient documentation

## 2012-01-28 DIAGNOSIS — N72 Inflammatory disease of cervix uteri: Secondary | ICD-10-CM

## 2012-01-28 NOTE — ED Notes (Signed)
Pt. Very anxious and stressed. Hx of anxiety attacks. HR 124.

## 2012-01-28 NOTE — ED Notes (Signed)
Pt noted to be tachycardic 140-150's; pt unable to stay still or sit d/t vaginal pain; reports hx of panic attacks and anxiety; pt does not want to sit down for EKG- pt brought back to room- spoke with EMT- will do EKG when pt lying down

## 2012-01-28 NOTE — ED Notes (Addendum)
Reports vaginal swelling and discharge for a couple of days; tried taking vaginisil but made worse; pt unable to sit d/t pain; pt tachycardiac reports very anxious unable to stay still; pt recently had baby via c-section 4 months ago

## 2012-01-29 LAB — URINALYSIS, DIPSTICK ONLY
Bilirubin Urine: NEGATIVE
Glucose, UA: NEGATIVE mg/dL
Hgb urine dipstick: NEGATIVE
Ketones, ur: NEGATIVE mg/dL
Protein, ur: NEGATIVE mg/dL

## 2012-01-29 LAB — WET PREP, GENITAL

## 2012-01-29 LAB — GC/CHLAMYDIA PROBE AMP, GENITAL
Chlamydia, DNA Probe: NEGATIVE
GC Probe Amp, Genital: NEGATIVE

## 2012-01-29 LAB — PREGNANCY, URINE: Preg Test, Ur: NEGATIVE

## 2012-01-29 MED ORDER — HYDROCODONE-ACETAMINOPHEN 5-500 MG PO TABS: 1.0000 | ORAL_TABLET | Freq: Four times a day (QID) | ORAL | Status: AC | PRN

## 2012-01-29 MED ORDER — LIDOCAINE HCL (PF) 1 % IJ SOLN
INTRAMUSCULAR | Status: AC
  Administered 2012-01-29: 01:00:00
  Filled 2012-01-29: qty 5

## 2012-01-29 MED ORDER — CEFTRIAXONE SODIUM 250 MG IJ SOLR
INTRAMUSCULAR | Status: AC
  Filled 2012-01-29: qty 250

## 2012-01-29 MED ORDER — DOXYCYCLINE HYCLATE 100 MG PO CAPS: 100.0000 mg | ORAL_CAPSULE | Freq: Two times a day (BID) | ORAL | Status: AC

## 2012-01-29 MED ORDER — AZITHROMYCIN 1 G PO PACK
1.0000 g | PACK | Freq: Once | ORAL | Status: AC
  Administered 2012-01-29: 1 g via ORAL

## 2012-01-29 MED ORDER — METRONIDAZOLE 500 MG PO TABS: 500.0000 mg | ORAL_TABLET | Freq: Two times a day (BID) | ORAL | Status: AC

## 2012-01-29 MED ORDER — AZITHROMYCIN 1 G PO PACK
PACK | ORAL | Status: AC
  Filled 2012-01-29: qty 1

## 2012-01-29 MED ORDER — CEFTRIAXONE SODIUM 250 MG IJ SOLR
250.0000 mg | Freq: Once | INTRAMUSCULAR | Status: AC
  Administered 2012-01-29: 250 mg via INTRAMUSCULAR

## 2012-01-29 MED ORDER — HYDROCODONE-ACETAMINOPHEN 5-325 MG PO TABS
1.0000 | ORAL_TABLET | Freq: Once | ORAL | Status: AC
  Administered 2012-01-29: 1 via ORAL
  Filled 2012-01-29: qty 1

## 2012-01-29 NOTE — ED Notes (Signed)
Prescriptions X3 given with discharge instructions.

## 2012-01-29 NOTE — ED Provider Notes (Addendum)
History     CSN: [id number]  Arrival date & time 01/28/12  2331   First MD Initiated Contact with Patient 01/28/12 2355      Chief Complaint  Patient presents with  . Vaginal Itching  . Tachycardia    (Consider location/radiation/quality/duration/timing/severity/associated sxs/prior treatment) Patient is a 21 y.o. female presenting with vaginal itching. The history is provided by the patient.  Vaginal Itching This is a new problem. The current episode started more than 1 week ago. The problem occurs constantly. The problem has been gradually worsening. Pertinent negatives include no chest pain, no abdominal pain, no headaches and no shortness of breath. Nothing aggravates the symptoms. Nothing relieves the symptoms.    Past Medical History  Diagnosis Date  . Anxiety     Panic Attacks    Past Surgical History  Procedure Date  . Cesarean section 09/28/2011    Procedure: CESAREAN SECTION;  Surgeon: Bing Plume, MD;  Location: [facility];  Service: Gynecology;  Laterality: N/A;  Primary cesarean section with delivery of baby girl at  67. Apgars 9/10.    History reviewed. No pertinent family history.  History  Substance Use Topics  . Smoking status: Never Smoker   . Smokeless tobacco: Not on file  . Alcohol Use: No    OB History    Grav Para Term Preterm Abortions TAB SAB Ect Mult Living   1 1 1       1       Review of Systems  Respiratory: Negative for shortness of breath and wheezing.   Cardiovascular: Negative for chest pain, palpitations and leg swelling.  Gastrointestinal: Negative for abdominal pain.  Genitourinary: Positive for vaginal discharge.  Neurological: Negative for headaches.  Psychiatric/Behavioral: The patient is nervous/anxious.   All other systems reviewed and are negative.    Allergies  Latex  Home Medications   Current Outpatient Rx  Name Route Sig Dispense Refill  . NORGESTREL-ETHINYL ESTRADIOL 0.3-30 MG-MCG PO TABS Oral Take 1  tablet by mouth daily.      BP 140/91  Pulse 147  Temp 98.6 F (37 C) (Oral)  Resp 19  SpO2 100%  Breastfeeding? Unknown  Physical Exam  Constitutional: She is oriented to person, place, and time. She appears well-developed and well-nourished.  HENT:  Head: Normocephalic and atraumatic.  Eyes: Conjunctivae and EOM are normal. Pupils are equal, round, and reactive to light.  Neck: Normal range of motion.  Cardiovascular: Regular rhythm and normal heart sounds.  Tachycardia present.   Pulmonary/Chest: Effort normal and breath sounds normal.  Abdominal: Soft. Bowel sounds are normal. She exhibits no distension. There is no tenderness. There is no rebound.  Genitourinary: Vaginal discharge found.       Excoriated external vagina with purulent cervical discharge noted  Musculoskeletal: Normal range of motion.  Neurological: She is alert and oriented to person, place, and time.  Skin: Skin is warm and dry.  Psychiatric: She has a normal mood and affect. Her behavior is normal.    ED Course  Procedures (including critical care time)   Labs Reviewed  GC/CHLAMYDIA PROBE AMP, GENITAL  WET PREP, GENITAL  URINALYSIS, DIPSTICK ONLY  PREGNANCY, URINE   No results found.   No diagnosis found.    MDM  + vaginal discharge. No abd pain.  Will check for std reassess.  Pt states that has anxiety.  Noted earlier heart rate.  rapildy improved to current approx 102     Date: 01/29/2012  Rate: 127  Rhythm: sinus tachycardia  QRS Axis: normal  Intervals: normal  ST/T Wave abnormalities: normal  Conduction Disutrbances:none  Narrative Interpretation:   Old EKG Reviewed: none available    Azusena Erlandson Lytle Michaels, MD 01/29/12 0033  Darlean Warmoth Lytle Michaels, MD 01/29/12 1610

## 2013-01-07 ENCOUNTER — Encounter (HOSPITAL_COMMUNITY): Payer: Self-pay | Admitting: Emergency Medicine

## 2013-01-07 ENCOUNTER — Emergency Department (HOSPITAL_COMMUNITY)
Admission: EM | Admit: 2013-01-07 | Discharge: 2013-01-07 | Disposition: A | Discharge status: Home / Self Care | Payer: Medicaid Other | Source: Home / Self Care | Attending: Family Medicine | Admitting: Family Medicine

## 2013-01-07 DIAGNOSIS — M67432 Ganglion, left wrist: Secondary | ICD-10-CM

## 2013-01-07 DIAGNOSIS — M6740 Ganglion, unspecified site: Secondary | ICD-10-CM

## 2013-01-07 NOTE — ED Provider Notes (Signed)
   History    CSN: [id number] Arrival date & time 01/07/13  1015  First MD Initiated Contact with Patient 01/07/13 1035     Chief Complaint  Patient presents with  . Cyst   (Consider location/radiation/quality/duration/timing/severity/associated sxs/prior Treatment) HPI Comments: 22 year old female right-handed. Here complaining of a "bump" in her left wrist for over 2 months. Area is nontender but patient states "it gets larger sometimes and then shrinks". Patient works as a Child psychotherapist and states that she has to carry heavy trays over her hyperextended left hand hand, while she positions or pick up dishes on tables.  Past Medical History  Diagnosis Date  . Anxiety     Panic Attacks   Past Surgical History  Procedure Laterality Date  . Cesarean section  09/28/2011    Procedure: CESAREAN SECTION;  Surgeon: Bing Plume, MD;  Location: [facility];  Service: Gynecology;  Laterality: N/A;  Primary cesarean section with delivery of baby girl at  96. Apgars 9/10.   No family history on file. History  Substance Use Topics  . Smoking status: Never Smoker   . Smokeless tobacco: Not on file  . Alcohol Use: No   OB History   Grav Para Term Preterm Abortions TAB SAB Ect Mult Living   1 1 1       1      Review of Systems  Constitutional: Negative for fever, chills and fatigue.  Musculoskeletal: Negative for joint swelling and arthralgias.  Skin: Negative for rash.    Allergies  Latex  Home Medications   Current Outpatient Rx  Name  Route  Sig  Dispense  Refill  . norgestrel-ethinyl estradiol (LO/OVRAL,CRYSELLE) 0.3-30 MG-MCG tablet   Oral   Take 1 tablet by mouth daily.          BP 139/73  Pulse 78  Temp(Src) 98.6 F (37 C) (Oral)  Resp 18  SpO2 100%  LMP 12/07/2012  Breastfeeding? No Physical Exam  Nursing note and vitals reviewed. Constitutional: She is oriented to person, place, and time. She appears well-developed and well-nourished. No distress.  HENT:  Head:  Normocephalic and atraumatic.  Cardiovascular: Normal heart sounds.   Pulmonary/Chest: Breath sounds normal.  Musculoskeletal:  Left wrist: there is a small cyst less than one cm located in volar aspect of the wrist in the radial side. No tender to palpation more obvious with wrist hyperextension, decreases in size with wrist position changes. Wrist is otherwise normal with FROM and no tenderness, erythema or swelling. Radial ulnar pulses patent. Intact superficial sensation.  Lymphadenopathy:    She has no cervical adenopathy.  Neurological: She is alert and oriented to person, place, and time.  Skin: No rash noted. She is not diaphoretic.    ED Course  Procedures (including critical care time) Labs Reviewed - No data to display No results found. 1. Ganglion cyst of wrist, left     MDM  Small nonsymptomatic ganglion cyst of the volar surface of the left wrist. Recommended to wear an elastic wristband at work. Orthopedic referral to followup if needed. Supportive care and red flags that should prompt her return to medical attention discussed with patient and provided in writing.  Sharin Grave, MD 01/09/13 [phone]

## 2013-01-07 NOTE — ED Notes (Signed)
Reports cyst left wrist .  Has been present for months.  Bump grows larger, then shrinks .  Bump does not itch or hurt

## 2014-05-16 ENCOUNTER — Encounter (HOSPITAL_COMMUNITY): Payer: Self-pay | Admitting: Emergency Medicine

## 2016-08-01 LAB — OB RESULTS CONSOLE GC/CHLAMYDIA
Chlamydia: NEGATIVE
GC PROBE AMP, GENITAL: NEGATIVE

## 2016-08-01 LAB — OB RESULTS CONSOLE ANTIBODY SCREEN: ANTIBODY SCREEN: NEGATIVE

## 2016-08-01 LAB — OB RESULTS CONSOLE HIV ANTIBODY (ROUTINE TESTING): HIV: NONREACTIVE

## 2016-08-01 LAB — OB RESULTS CONSOLE ABO/RH: RH Type: POSITIVE

## 2016-08-01 LAB — OB RESULTS CONSOLE HEPATITIS B SURFACE ANTIGEN: Hepatitis B Surface Ag: NEGATIVE

## 2016-08-01 LAB — OB RESULTS CONSOLE RUBELLA ANTIBODY, IGM: RUBELLA: IMMUNE

## 2016-08-01 LAB — OB RESULTS CONSOLE RPR: RPR: NONREACTIVE

## 2016-12-10 ENCOUNTER — Encounter (HOSPITAL_COMMUNITY): Payer: Self-pay

## 2016-12-10 ENCOUNTER — Inpatient Hospital Stay (HOSPITAL_COMMUNITY)
Admission: AD | Admit: 2016-12-10 | Discharge: 2016-12-11 | Disposition: A | Discharge status: Home / Self Care | Payer: Medicaid Other | Source: Ambulatory Visit | Attending: Obstetrics and Gynecology | Admitting: Obstetrics and Gynecology

## 2016-12-10 DIAGNOSIS — O4703 False labor before 37 completed weeks of gestation, third trimester: Secondary | ICD-10-CM

## 2016-12-10 DIAGNOSIS — F419 Anxiety disorder, unspecified: Secondary | ICD-10-CM | POA: Diagnosis not present

## 2016-12-10 DIAGNOSIS — O99343 Other mental disorders complicating pregnancy, third trimester: Secondary | ICD-10-CM | POA: Diagnosis not present

## 2016-12-10 DIAGNOSIS — O26893 Other specified pregnancy related conditions, third trimester: Secondary | ICD-10-CM | POA: Diagnosis present

## 2016-12-10 DIAGNOSIS — R102 Pelvic and perineal pain: Secondary | ICD-10-CM | POA: Diagnosis present

## 2016-12-10 DIAGNOSIS — Z3A30 30 weeks gestation of pregnancy: Secondary | ICD-10-CM | POA: Insufficient documentation

## 2016-12-10 LAB — URINALYSIS, ROUTINE W REFLEX MICROSCOPIC
Bilirubin Urine: NEGATIVE
Glucose, UA: NEGATIVE mg/dL
Hgb urine dipstick: NEGATIVE
KETONES UR: NEGATIVE mg/dL
LEUKOCYTES UA: NEGATIVE
NITRITE: NEGATIVE
PH: 7 (ref 5.0–8.0)
Protein, ur: NEGATIVE mg/dL
SPECIFIC GRAVITY, URINE: 1.008 (ref 1.005–1.030)

## 2016-12-10 LAB — WET PREP, GENITAL
CLUE CELLS WET PREP: NONE SEEN
SPERM: NONE SEEN
TRICH WET PREP: NONE SEEN
Yeast Wet Prep HPF POC: NONE SEEN

## 2016-12-10 NOTE — MAU Provider Note (Signed)
Chief Complaint:  Abdominal Pain   First Provider Initiated Contact with Patient 12/10/16 2334      HPI: Shelley Merritt is a 26 y.o. G2P1001 at 8530w4dwho presents to maternity admissions reporting pain in her low abdomen and pelvis starting at 9pm tonight. She reports the pain is intermittent, low in her pelvis, and increases with walking/movement.  She has not tried any treatments, nothing makes it better. She has anxiety about taking medications and only takes her seroquel for anxiety, which she took tonight.  Movement/walking makes her pain worse.  It has no associated symptoms.  She reports she walked a lot today and may have overdone it. She reports good fetal movement, denies LOF, vaginal bleeding, vaginal itching/burning, urinary symptoms, h/a, dizziness, n/v, or fever/chills.    HPI  Past Medical History: Past Medical History:  Diagnosis Date  . Anxiety    Panic Attacks    Past obstetric history: OB History  Gravida Para Term Preterm AB Living  2 1 1     1   SAB TAB Ectopic Multiple Live Births          1    # Outcome Date GA Lbr Len/2nd Weight Sex Delivery Anes PTL Lv  2 Current           1 Term 09/28/11 3296w0d  6 lb 2.4 oz (2.79 kg) F CS-LVertical EPI  LIV     Birth Comments: good tone and spontaneous cries at birth; no dysmorphic features      Past Surgical History: Past Surgical History:  Procedure Laterality Date  . CESAREAN SECTION  09/28/2011   Procedure: CESAREAN SECTION;  Surgeon: Bing Plumehomas F Henley, MD;  Location: [facility];  Service: Gynecology;  Laterality: N/A;  Primary cesarean section with delivery of baby girl at  [id number]. Apgars 9/10.    Family History: No family history on file.  Social History: Social History  Substance Use Topics  . Smoking status: Never Smoker  . Smokeless tobacco: Not on file  . Alcohol use No    Allergies:  Allergies  Allergen Reactions  . Latex Rash    Meds:  Prescriptions Prior to Admission  Medication Sig Dispense Refill  Last Dose  . norgestrel-ethinyl estradiol (LO/OVRAL,CRYSELLE) 0.3-30 MG-MCG tablet Take 1 tablet by mouth daily.   01/28/2012 at Unknown    ROS:  Review of Systems  Constitutional: Negative for chills, fatigue and fever.  Eyes: Negative for visual disturbance.  Respiratory: Negative for shortness of breath.   Cardiovascular: Negative for chest pain.  Gastrointestinal: Positive for abdominal pain. Negative for nausea and vomiting.  Genitourinary: Positive for pelvic pain. Negative for difficulty urinating, dysuria, flank pain, vaginal bleeding, vaginal discharge and vaginal pain.  Neurological: Negative for dizziness and headaches.  Psychiatric/Behavioral: Negative.      I have reviewed patient's Past Medical Hx, Surgical Hx, Family Hx, Social Hx, medications and allergies.   Physical Exam   Patient Vitals for the past 24 hrs:  BP Temp Temp src Pulse Resp SpO2 Height Weight  12/10/16 2228 112/66 97.9 F (36.6 C) Oral (!) 103 18 100 % 5\' 2"  (1.575 m) 153 lb (69.4 kg)   Constitutional: Well-developed, well-nourished female in no acute distress.  Cardiovascular: normal rate Respiratory: normal effort GI: Abd soft, non-tender, gravid appropriate for gestational age.  MS: Extremities nontender, no edema, normal ROM Neurologic: Alert and oriented x 4.  GU: Neg CVAT.  PELVIC EXAM:  FFN collected by blind swab first then attempt at cervical exam.  Vaginal exam not well tolerated.  Pt reports soreness of vagina and lower abdomen during exam and unable to tolerate exam, despite attempts at relaxation and reassurance provided by CNM. White vaginal discharge noted at introitus prior to exam.  Wet prep collected by blind swab.  Reassurance and relaxation provided again.  Pt declined medications but agreed to cervical exam.   Recheck of cervix after 2 hours in MAU: Closed/thick/high, posterior     FHT:  Baseline 145 , moderate variability, accelerations present, no  decelerations Contractions: q 1-2 mins, mild to palpation   Labs: Results for orders placed or performed during the hospital encounter of 12/10/16 (from the past 24 hour(s))  Urinalysis, Routine w reflex microscopic     Status: Abnormal   Collection Time: 12/10/16 10:25 PM  Result Value Ref Range   Color, Urine STRAW (A) YELLOW   APPearance CLEAR CLEAR   Specific Gravity, Urine 1.008 1.005 - 1.030   pH 7.0 5.0 - 8.0   Glucose, UA NEGATIVE NEGATIVE mg/dL   Hgb urine dipstick NEGATIVE NEGATIVE   Bilirubin Urine NEGATIVE NEGATIVE   Ketones, ur NEGATIVE NEGATIVE mg/dL   Protein, ur NEGATIVE NEGATIVE mg/dL   Nitrite NEGATIVE NEGATIVE   Leukocytes, UA NEGATIVE NEGATIVE  Wet prep, genital     Status: Abnormal   Collection Time: 12/10/16 11:30 PM  Result Value Ref Range   Yeast Wet Prep HPF POC NONE SEEN NONE SEEN   Trich, Wet Prep NONE SEEN NONE SEEN   Clue Cells Wet Prep HPF POC NONE SEEN NONE SEEN   WBC, Wet Prep HPF POC FEW (A) NONE SEEN   Sperm NONE SEEN   Fetal fibronectin     Status: None   Collection Time: 12/10/16 11:30 PM  Result Value Ref Range   Fetal Fibronectin NEGATIVE NEGATIVE      Imaging:  No results found.  MAU Course/MDM: FFN UA Wet prep NST reactive, toco with frequent contractions, mild to palpation Pt declined IV fluids and medications, and reports anxiety about both Vaginal exam not tolerated by pt.  FFN collected and is negative. Pt agrees to cervical exam instead of medications and tolerated well the second time with relaxation and reassurance.   Cervix closed/thick/high after 2 hours in MAU so no signs of labor today Consult Dr Mindi Slicker with presentation, exam findings and test results.  Pt stable at time of discharge.  Today's evaluation included a work-up for preterm labor which can be life-threatening for both mom and baby.  Assessment: 1. Threatened preterm labor, third trimester   2. Pelvic pain affecting pregnancy in third trimester,  antepartum     Plan: Discharge home Labor precautions and fetal kick counts Follow-up Information    Meisinger, Tawanna Cooler, MD Follow up.   Specialty:  Obstetrics and Gynecology Why:  As scheduled today, return to MAU as needed for emergencies Contact information: [address] [place] [address] [phone]          Allergies as of 12/11/2016      Reactions   Latex Rash      Medication List    STOP taking these medications   norgestrel-ethinyl estradiol 0.3-30 MG-MCG tablet Commonly known as:  Ilda Basset Certified Nurse-Midwife 12/11/2016 1:16 AM

## 2016-12-10 NOTE — MAU Note (Signed)
Pt here with abdominal pain worsening today, denies any bleeding. Has had some leaking of white fluid. Reports positive fetal movement.

## 2016-12-11 DIAGNOSIS — O4703 False labor before 37 completed weeks of gestation, third trimester: Secondary | ICD-10-CM

## 2016-12-11 LAB — FETAL FIBRONECTIN: Fetal Fibronectin: NEGATIVE

## 2016-12-11 MED ORDER — NIFEDIPINE 10 MG PO CAPS: 10.0000 mg | ORAL_CAPSULE | ORAL | Status: DC | PRN

## 2016-12-11 NOTE — Discharge Instructions (Signed)
°Pelvic pain (SPD) °Approved by the [facility] [employer] Advisory Board °Share ° ° °In this article °What is symphysis pubis dysfunction?  °What are the symptoms of SPD?  °What causes SPD?  °How is SPD diagnosed?  °How is SPD treated?  °What can I do to ease the pain of SPD?  °Will I recover from SPD after I’ve had my baby ?  °Where can I get help and support? °Video  °Your pelvic floor °Finding and exercising this crucial area. What is symphysis pubis dysfunction?Symphysis pubis dysfunction (SPD) is a problem with the pelvis. Your pelvis is mainly formed of two pubic bones that curve round to make a cradle shape. The pubic bones meet at the front of your pelvis, at a firm joint called the symphysis pubis.  ° °The joint's connection is made strong by a dense network of tough tissues (ligaments). During pregnancy, swelling and pain can make the symphysis pubis joint less stable, causing SPD. ° °Doctors and physiotherapists classify any type of pelvic pain during pregnancy as pelvic girdle pain (PGP).  ° °SPD is one type of pelvic girdle pain. Diastasis symphysis pubis (DSP) is another type of pelvic girdle pain, which is related to SPD. DSP happens when the gap in the symphysis pubis joint widens too far. DSP is rare, and can only be diagnosed by an X-ray, ultrasound scan or MRI scan. What are the symptoms of SPD? Pain in the pubic area and groin are the most common symptoms, though you may also notice:  ° °Back pain, pain at the back of your pelvis or hip pain.  °Pain, along with a grinding or clicking sensation in your pubic area.  °Pain down the inside of your thighs or between your legs.  °Pain that's made worse by parting your legs, walking, going up or down stairs or moving around in bed.  °Pain that's worse at night and stops you from sleeping well. Getting up to go to the toilet in the middle of the night can be especially painful. ° °SPD can occur at any time during your pregnancy or after giving birth. You  may notice it for the first time during the middle of your pregnancy. What causes SPD?During pregnancy, your body produces a hormone called relaxin, which softens your ligaments to help your baby pass through your pelvis. This means that the joints in your pelvis naturally become more lax.  ° °However, this flexibility doesn't necessarily cause the painful problems of SPD. Usually, your nerves and muscles are able to adapt and compensate for the greater flexibility in your joints. This means your body should cope well with the changes to your posture as your baby grows. ° °SPD is thought to happen when your body doesn't adapt so well to the stretchier, looser ligaments caused by relaxin. SPD can be triggered by: ° °the joints in your pelvis moving unevenly  °changes to the way your muscles work to support your pelvic girdle joints  °one pelvic joint not working properly and causing knock-on pain in the other joints of your pelvis ° °These problems mean that your pelvis is not as stable as it should be, and this is what causes SPD. Physiotherapy is the best way to treat SPD, because it's about the relationship between your muscles and bones, rather than how lax your joints are. You're more likely to develop SPD if: ° °you had pelvic girdle pain or pelvic joint pain before you became pregnant  °you've had a previous injury to your   pelvis  °you've had pelvic girdle pain in a previous pregnancy  °you have a high BMI and were overweight before you became pregnant  °hypermobility in all your joints  °How is SPD diagnosed? Your doctor or midwife should refer you to a women’s health physiotherapist. Your physiotherapist will test the stability, movement and pain in your pelvic joints and muscles. How is SPD treated?SPD is managed in the same way as other pelvic girdle pain. Treatment includes:  ° °Exercises to strengthen your spinal, tummy, pelvic girdle, hip and pelvic floor muscles. These will improve the stability of  your pelvis and back. You may need gentle, hands-on treatment of your hip, back or pelvis to correct stiffness or imbalance. Water gymnastics can sometimes help.  °Your physiotherapist should advise you on how to make daily activities less painful and on how to make the birth of your baby easier. Your midwife should help you to write a birth plan that takes into account your SPD symptoms.  °Acupuncture may help reduce the pain and is safe during pregnancy. Make sure your practitioner is trained and experienced in working with pregnant women.  °Other manual therapies, such as osteopathy may help. See a registered practitioner who is experienced in treating pregnant women.  °A pelvic support belt may give relief, particularly when you're exercising or active. °What can I do to ease the pain of SPD?  °Be as active as you can, but don't push yourself so far that it hurts.  °Stick to the pelvic floor and tummy exercises that your physiotherapist recommends.  °Ask for and accept offers of help with daily chores.  °Plan ahead so that you reduce the activities that cause you problems. You could use a rucksack to carry things around, both indoors and out.  °Take care to part your legs no further than your pain-free range, particularly when getting in and out of the car, bed or bath. If you are lying down, pull up your knees as far as you can to make it easier to part your legs. If you are sitting, try arching your back and sticking your chest out before parting or moving your legs.  °Avoid activities that make your pain worse or that put your pelvis in an uneven position, such as sitting cross-legged or carrying your toddler on your hip. If something hurts, stop doing it. If the pain is allowed to flare up, it can take a long time to settle down again.  °Try to sleep on your side with legs bent and a pillow between your knees.  °Rest regularly or sit down for activities you would normally do standing, such as ironing. By  sitting on a birth ball or by getting down on your hands and knees, you'll take the weight of your baby off your pelvis.  °Try not to do heavy lifting or pushing. Pushing supermarket trolleys can often make your pain worse, so shop online or ask someone to shop for you.  °When climbing stairs, take one step at a time. Step up onto one step with your best leg and then bring your other leg to meet it. Repeat with each step.  °Avoid standing on one leg. When getting dressed, sit down to pull on your knickers or trousers. °Will I recover from SPD after I’ve had my baby ? You’re very likely to recover within a few weeks to a few months after your baby is born. If you can, carry on with physiotherapy after the birth. Try to get   help with looking after your baby during the early weeks.  ° °You may find you get twinges every month just before your period is due. This is likely to be caused by hormones that have a similar effect to pregnancy hormones.  ° °If you have SPD in one pregnancy, it is more likely that you’ll have it next time you get pregnant. Ask your midwife to refer you to a physiotherapist early on. SPD may not necessarily be as bad next time if it is well managed from the start of pregnancy.  ° °You could consider giving yourself a bit of time from one pregnancy to the next. Losing excess weight, getting fit and waiting until your children can walk may help reduce the symptoms of getting any type of pelvic pain next time. Where can I get help and support?The Association of Chartered Physiotherapists in Women’s Health can provide a list of physiotherapists in your area. ° °You can get in touch with other women in your situation by contacting [employer], a charity that offers support to women with pelvic girdle pain, including SPD, or by visiting our community.  ° °See our photo guide to pregnancy stretches, designed to help relieve those aches and pains.  ° °Last reviewed: July 2015 °

## 2017-01-14 LAB — OB RESULTS CONSOLE GBS: GBS: POSITIVE

## 2017-01-29 ENCOUNTER — Telehealth (HOSPITAL_COMMUNITY): Payer: Self-pay | Admitting: *Deleted

## 2017-01-29 NOTE — Telephone Encounter (Signed)
Preadmission screen  

## 2017-01-29 NOTE — Congregational Nurse Program (Signed)
Congregational Nurse Program Note  Date of Encounter: 01/29/2017  Past Medical History: Past Medical History:  Diagnosis Date  . Anxiety    Panic Attacks    Encounter Details:     CNP Questionnaire - 01/29/17 1721      Patient Demographics   Is this a new or existing patient? New   Patient is considered a/an Not Applicable   Race African-American/Black     Patient Assistance   Location of Patient Assistance [facility]   Patient's financial/insurance status Medicaid   Uninsured Patient ([place] [employer]s) No   Patient referred to apply for the following financial assistance Not Applicable   Food insecurities addressed Not Applicable   Transportation assistance No   Assistance securing medications No   Educational health offerings Interpersonal relationships     Encounter Details   Primary purpose of visit Education/Health Concerns;Safety   Was an Emergency Department visit averted? Not Applicable   Does patient have a medical provider? Yes   Patient referred to Other   Was a mental health screening completed? (GAINS tool) No   Does patient have dental issues? No   Does patient have vision issues? No   Does your patient have an abnormal blood pressure today? No   Since previous encounter, have you referred patient for abnormal blood pressure that resulted in a new diagnosis or medication change? No   Does your patient have an abnormal blood glucose today? No   Since previous encounter, have you referred patient for abnormal blood glucose that resulted in a new diagnosis or medication change? No   Was there a life-saving intervention made? No     Client  in to center to find a place to stay. She and her family are homeless and she is due to have delivery on 02/07/2017. [facility] and Lindy( [employer] ordinator) states that they will address her needs on tomorrow. [address]Shelley Huebsch RN [facility]  [phone].

## 2017-01-30 ENCOUNTER — Telehealth (HOSPITAL_COMMUNITY): Payer: Self-pay | Admitting: *Deleted

## 2017-01-30 NOTE — Telephone Encounter (Signed)
Preadmission screen  

## 2017-01-31 ENCOUNTER — Telehealth (HOSPITAL_COMMUNITY): Payer: Self-pay | Admitting: *Deleted

## 2017-01-31 NOTE — Telephone Encounter (Signed)
Preadmission screen  

## 2017-02-03 ENCOUNTER — Inpatient Hospital Stay (HOSPITAL_COMMUNITY): Payer: Medicaid Other | Admitting: Anesthesiology

## 2017-02-03 ENCOUNTER — Encounter (HOSPITAL_COMMUNITY): Payer: Self-pay | Admitting: Emergency Medicine

## 2017-02-03 ENCOUNTER — Inpatient Hospital Stay (HOSPITAL_COMMUNITY)
Admission: AD | Admit: 2017-02-03 | Discharge: 2017-02-06 | DRG: 766 | Disposition: A | Discharge status: Home / Self Care | Payer: Medicaid Other | Source: Ambulatory Visit | Attending: Obstetrics and Gynecology | Admitting: Obstetrics and Gynecology

## 2017-02-03 ENCOUNTER — Encounter (HOSPITAL_COMMUNITY): Payer: Self-pay | Admitting: *Deleted

## 2017-02-03 ENCOUNTER — Emergency Department (HOSPITAL_COMMUNITY)
Admission: EM | Admit: 2017-02-03 | Discharge: 2017-02-03 | Disposition: A | Discharge status: Home / Self Care | Payer: Medicaid Other | Source: Home / Self Care | Attending: Emergency Medicine | Admitting: Emergency Medicine

## 2017-02-03 ENCOUNTER — Encounter (HOSPITAL_COMMUNITY)
Admission: AD | Disposition: A | Discharge status: Home / Self Care | Payer: Self-pay | Source: Ambulatory Visit | Attending: Obstetrics and Gynecology

## 2017-02-03 DIAGNOSIS — F419 Anxiety disorder, unspecified: Secondary | ICD-10-CM | POA: Diagnosis present

## 2017-02-03 DIAGNOSIS — O99344 Other mental disorders complicating childbirth: Secondary | ICD-10-CM | POA: Diagnosis present

## 2017-02-03 DIAGNOSIS — Z98891 History of uterine scar from previous surgery: Secondary | ICD-10-CM

## 2017-02-03 DIAGNOSIS — O34211 Maternal care for low transverse scar from previous cesarean delivery: Secondary | ICD-10-CM | POA: Diagnosis present

## 2017-02-03 DIAGNOSIS — O99824 Streptococcus B carrier state complicating childbirth: Secondary | ICD-10-CM | POA: Diagnosis present

## 2017-02-03 DIAGNOSIS — Z3A38 38 weeks gestation of pregnancy: Secondary | ICD-10-CM

## 2017-02-03 DIAGNOSIS — K0889 Other specified disorders of teeth and supporting structures: Secondary | ICD-10-CM

## 2017-02-03 HISTORY — DX: Unspecified infectious disease: B99.9

## 2017-02-03 HISTORY — DX: Anemia, unspecified: D64.9

## 2017-02-03 HISTORY — DX: Headache: R51

## 2017-02-03 HISTORY — DX: Headache, unspecified: R51.9

## 2017-02-03 HISTORY — DX: Dermatitis, unspecified: L30.9

## 2017-02-03 LAB — CBC
HCT: 37.1 % (ref 36.0–46.0)
HEMOGLOBIN: 12.1 g/dL (ref 12.0–15.0)
MCH: 27.4 pg (ref 26.0–34.0)
MCHC: 32.6 g/dL (ref 30.0–36.0)
MCV: 84.1 fL (ref 78.0–100.0)
Platelets: 169 10*3/uL (ref 150–400)
RBC: 4.41 MIL/uL (ref 3.87–5.11)
RDW: 17.3 % — ABNORMAL HIGH (ref 11.5–15.5)
WBC: 8.2 10*3/uL (ref 4.0–10.5)

## 2017-02-03 LAB — POCT FERN TEST: POCT FERN TEST: POSITIVE

## 2017-02-03 LAB — TYPE AND SCREEN
ABO/RH(D): A POS
ANTIBODY SCREEN: NEGATIVE

## 2017-02-03 LAB — ABO/RH: ABO/RH(D): A POS

## 2017-02-03 SURGERY — Surgical Case
Anesthesia: Spinal | Site: Abdomen | Wound class: Clean Contaminated

## 2017-02-03 MED ORDER — NALBUPHINE SYRINGE 5 MG/0.5 ML
5.0000 mg | INJECTION | INTRAMUSCULAR | Status: DC | PRN
  Filled 2017-02-03: qty 0.5

## 2017-02-03 MED ORDER — OXYTOCIN 10 UNIT/ML IJ SOLN
INTRAVENOUS | Status: DC | PRN
  Administered 2017-02-03: 40 [IU] via INTRAVENOUS

## 2017-02-03 MED ORDER — SIMETHICONE 80 MG PO CHEW
80.0000 mg | CHEWABLE_TABLET | Freq: Three times a day (TID) | ORAL | Status: DC
  Administered 2017-02-05: 80 mg via ORAL
  Filled 2017-02-03 (×3): qty 1

## 2017-02-03 MED ORDER — ACETAMINOPHEN 325 MG PO TABS
650.0000 mg | ORAL_TABLET | ORAL | Status: DC | PRN
  Administered 2017-02-04 – 2017-02-06 (×3): 650 mg via ORAL
  Filled 2017-02-03 (×3): qty 2

## 2017-02-03 MED ORDER — MEPERIDINE HCL 25 MG/ML IJ SOLN
6.2500 mg | INTRAMUSCULAR | Status: DC | PRN
  Administered 2017-02-03: 6.25 mg via INTRAVENOUS

## 2017-02-03 MED ORDER — ONDANSETRON HCL 4 MG/2ML IJ SOLN
4.0000 mg | Freq: Four times a day (QID) | INTRAMUSCULAR | Status: DC | PRN
Start: 2017-02-03 — End: 2017-02-03

## 2017-02-03 MED ORDER — MORPHINE SULFATE (PF) 0.5 MG/ML IJ SOLN
INTRAMUSCULAR | Status: DC | PRN
  Administered 2017-02-03: .2 mg via INTRATHECAL

## 2017-02-03 MED ORDER — KETOROLAC TROMETHAMINE 30 MG/ML IJ SOLN
INTRAMUSCULAR | Status: AC
  Filled 2017-02-03: qty 1

## 2017-02-03 MED ORDER — CEFAZOLIN SODIUM-DEXTROSE 2-4 GM/100ML-% IV SOLN
2.0000 g | INTRAVENOUS | Status: AC
  Administered 2017-02-03: 2 g via INTRAVENOUS

## 2017-02-03 MED ORDER — PRENATAL MULTIVITAMIN CH
1.0000 | ORAL_TABLET | Freq: Every day | ORAL | Status: DC
  Administered 2017-02-06: 1 via ORAL
  Filled 2017-02-03 (×3): qty 1

## 2017-02-03 MED ORDER — MIDAZOLAM HCL 2 MG/2ML IJ SOLN
INTRAMUSCULAR | Status: AC
  Filled 2017-02-03: qty 2

## 2017-02-03 MED ORDER — IBUPROFEN 600 MG PO TABS
600.0000 mg | ORAL_TABLET | Freq: Four times a day (QID) | ORAL | Status: DC
  Administered 2017-02-03 – 2017-02-06 (×9): 600 mg via ORAL
  Filled 2017-02-03 (×11): qty 1

## 2017-02-03 MED ORDER — ZOLPIDEM TARTRATE 5 MG PO TABS: 5.0000 mg | ORAL_TABLET | Freq: Every evening | ORAL | Status: DC | PRN

## 2017-02-03 MED ORDER — MIDAZOLAM HCL 2 MG/2ML IJ SOLN
INTRAMUSCULAR | Status: DC | PRN
  Administered 2017-02-03 (×2): 1 mg via INTRAVENOUS

## 2017-02-03 MED ORDER — PHENYLEPHRINE 8 MG IN D5W 100 ML (0.08MG/ML) PREMIX OPTIME
INJECTION | INTRAVENOUS | Status: DC | PRN
  Administered 2017-02-03: 60 ug/min via INTRAVENOUS

## 2017-02-03 MED ORDER — ONDANSETRON HCL 4 MG/2ML IJ SOLN
INTRAMUSCULAR | Status: AC
  Filled 2017-02-03: qty 2

## 2017-02-03 MED ORDER — OXYCODONE HCL 5 MG/5ML PO SOLN: 5.0000 mg | Freq: Once | ORAL | Status: DC | PRN

## 2017-02-03 MED ORDER — TETANUS-DIPHTH-ACELL PERTUSSIS 5-2.5-18.5 LF-MCG/0.5 IM SUSP: 0.5000 mL | Freq: Once | INTRAMUSCULAR | Status: DC

## 2017-02-03 MED ORDER — DIPHENHYDRAMINE HCL 25 MG PO CAPS: 25.0000 mg | ORAL_CAPSULE | ORAL | Status: DC | PRN

## 2017-02-03 MED ORDER — MENTHOL 3 MG MT LOZG: 1.0000 | LOZENGE | OROMUCOSAL | Status: DC | PRN

## 2017-02-03 MED ORDER — BUPIVACAINE IN DEXTROSE 0.75-8.25 % IT SOLN
INTRATHECAL | Status: DC | PRN
  Administered 2017-02-03: 1.6 mg via INTRATHECAL

## 2017-02-03 MED ORDER — CEFAZOLIN SODIUM-DEXTROSE 2-4 GM/100ML-% IV SOLN: 2.0000 g | Freq: Once | INTRAVENOUS | Status: DC

## 2017-02-03 MED ORDER — BUPIVACAINE IN DEXTROSE 0.75-8.25 % IT SOLN
INTRATHECAL | Status: AC
Start: 2017-02-03 — End: 2017-02-03
  Filled 2017-02-03: qty 2

## 2017-02-03 MED ORDER — MORPHINE SULFATE (PF) 0.5 MG/ML IJ SOLN
INTRAMUSCULAR | Status: AC
  Filled 2017-02-03: qty 10

## 2017-02-03 MED ORDER — ONDANSETRON HCL 4 MG/2ML IJ SOLN: 4.0000 mg | Freq: Three times a day (TID) | INTRAMUSCULAR | Status: DC | PRN

## 2017-02-03 MED ORDER — NALBUPHINE SYRINGE 5 MG/0.5 ML
5.0000 mg | INJECTION | Freq: Once | INTRAMUSCULAR | Status: DC | PRN
  Filled 2017-02-03: qty 0.5

## 2017-02-03 MED ORDER — SODIUM CHLORIDE 0.9% FLUSH: 3.0000 mL | INTRAVENOUS | Status: DC | PRN

## 2017-02-03 MED ORDER — OXYTOCIN 40 UNITS IN LACTATED RINGERS INFUSION - SIMPLE MED: 2.5000 [IU]/h | INTRAVENOUS | Status: AC

## 2017-02-03 MED ORDER — FENTANYL CITRATE (PF) 100 MCG/2ML IJ SOLN
INTRAMUSCULAR | Status: AC
  Filled 2017-02-03: qty 2

## 2017-02-03 MED ORDER — OXYTOCIN 10 UNIT/ML IJ SOLN
INTRAMUSCULAR | Status: AC
  Filled 2017-02-03: qty 4

## 2017-02-03 MED ORDER — SCOPOLAMINE 1 MG/3DAYS TD PT72: 1.0000 | MEDICATED_PATCH | Freq: Once | TRANSDERMAL | Status: DC

## 2017-02-03 MED ORDER — ONDANSETRON HCL 4 MG/2ML IJ SOLN
INTRAMUSCULAR | Status: DC | PRN
  Administered 2017-02-03: 4 mg via INTRAVENOUS

## 2017-02-03 MED ORDER — BENZOCAINE 10 % MT GEL
Freq: Three times a day (TID) | OROMUCOSAL | Status: DC | PRN
  Administered 2017-02-03: 09:00:00 via OROMUCOSAL
  Filled 2017-02-03: qty 9

## 2017-02-03 MED ORDER — CEFAZOLIN SODIUM-DEXTROSE 2-4 GM/100ML-% IV SOLN
2.0000 g | INTRAVENOUS | Status: AC
  Administered 2017-02-03: 2 g via INTRAVENOUS
  Filled 2017-02-03: qty 100

## 2017-02-03 MED ORDER — FENTANYL CITRATE (PF) 100 MCG/2ML IJ SOLN: 25.0000 ug | INTRAMUSCULAR | Status: DC | PRN

## 2017-02-03 MED ORDER — SIMETHICONE 80 MG PO CHEW
80.0000 mg | CHEWABLE_TABLET | ORAL | Status: DC
  Administered 2017-02-03 – 2017-02-05 (×2): 80 mg via ORAL
  Filled 2017-02-03: qty 1

## 2017-02-03 MED ORDER — WITCH HAZEL-GLYCERIN EX PADS: 1.0000 "application " | MEDICATED_PAD | CUTANEOUS | Status: DC | PRN

## 2017-02-03 MED ORDER — LACTATED RINGERS IV SOLN
INTRAVENOUS | Status: DC
  Administered 2017-02-03 (×3): via INTRAVENOUS

## 2017-02-03 MED ORDER — SIMETHICONE 80 MG PO CHEW
80.0000 mg | CHEWABLE_TABLET | ORAL | Status: DC | PRN
  Filled 2017-02-03: qty 1

## 2017-02-03 MED ORDER — COCONUT OIL OIL
1.0000 "application " | TOPICAL_OIL | Status: DC | PRN
  Administered 2017-02-06: 1 via TOPICAL
  Filled 2017-02-03: qty 120

## 2017-02-03 MED ORDER — DIBUCAINE 1 % RE OINT: 1.0000 "application " | TOPICAL_OINTMENT | RECTAL | Status: DC | PRN

## 2017-02-03 MED ORDER — NALOXONE HCL 0.4 MG/ML IJ SOLN: 0.4000 mg | INTRAMUSCULAR | Status: DC | PRN

## 2017-02-03 MED ORDER — FENTANYL CITRATE (PF) 100 MCG/2ML IJ SOLN
INTRAMUSCULAR | Status: DC | PRN
  Administered 2017-02-03: 10 ug via INTRATHECAL

## 2017-02-03 MED ORDER — KETOROLAC TROMETHAMINE 30 MG/ML IJ SOLN: 30.0000 mg | Freq: Four times a day (QID) | INTRAMUSCULAR | Status: AC | PRN

## 2017-02-03 MED ORDER — LACTATED RINGERS IV SOLN
INTRAVENOUS | Status: DC
  Administered 2017-02-04: 12:00:00 via INTRAVENOUS

## 2017-02-03 MED ORDER — LACTATED RINGERS IV SOLN
INTRAVENOUS | Status: DC
  Administered 2017-02-03: 16:00:00 via INTRAVENOUS

## 2017-02-03 MED ORDER — SENNOSIDES-DOCUSATE SODIUM 8.6-50 MG PO TABS
2.0000 | ORAL_TABLET | ORAL | Status: DC
  Administered 2017-02-03: 2 via ORAL
  Filled 2017-02-03: qty 2

## 2017-02-03 MED ORDER — OXYCODONE HCL 5 MG PO TABS: 5.0000 mg | ORAL_TABLET | Freq: Once | ORAL | Status: DC | PRN

## 2017-02-03 MED ORDER — DIPHENHYDRAMINE HCL 50 MG/ML IJ SOLN: 12.5000 mg | INTRAMUSCULAR | Status: DC | PRN

## 2017-02-03 MED ORDER — KETOROLAC TROMETHAMINE 30 MG/ML IJ SOLN
30.0000 mg | Freq: Four times a day (QID) | INTRAMUSCULAR | Status: AC | PRN
  Administered 2017-02-03: 30 mg via INTRAMUSCULAR

## 2017-02-03 MED ORDER — MEPERIDINE HCL 25 MG/ML IJ SOLN
INTRAMUSCULAR | Status: AC
  Filled 2017-02-03: qty 1

## 2017-02-03 MED ORDER — NALOXONE HCL 2 MG/2ML IJ SOSY: 1.0000 ug/kg/h | PREFILLED_SYRINGE | INTRAVENOUS | Status: DC | PRN

## 2017-02-03 MED ORDER — SODIUM CHLORIDE 0.9 % IR SOLN
Status: DC | PRN
  Administered 2017-02-03: 1

## 2017-02-03 MED ORDER — DIPHENHYDRAMINE HCL 25 MG PO CAPS: 25.0000 mg | ORAL_CAPSULE | Freq: Four times a day (QID) | ORAL | Status: DC | PRN

## 2017-02-03 SURGICAL SUPPLY — 37 items
BENZOIN TINCTURE PRP APPL 2/3 (GAUZE/BANDAGES/DRESSINGS) ×3 IMPLANT
CHLORAPREP W/TINT 26ML (MISCELLANEOUS) ×3 IMPLANT
CLAMP CORD UMBIL (MISCELLANEOUS) IMPLANT
CLOSURE STERI STRIP 1/2 X4 (GAUZE/BANDAGES/DRESSINGS) ×3 IMPLANT
CLOSURE WOUND 1/2 X4 (GAUZE/BANDAGES/DRESSINGS)
CLOTH BEACON ORANGE TIMEOUT ST (SAFETY) ×3 IMPLANT
COVER LIGHT HANDLE  1/PK (MISCELLANEOUS) ×2
COVER LIGHT HANDLE 1/PK (MISCELLANEOUS) ×1 IMPLANT
DRSG OPSITE POSTOP 4X10 (GAUZE/BANDAGES/DRESSINGS) ×3 IMPLANT
ELECT REM PT RETURN 9FT ADLT (ELECTROSURGICAL) ×3
ELECTRODE REM PT RTRN 9FT ADLT (ELECTROSURGICAL) ×1 IMPLANT
EXTRACTOR VACUUM KIWI (MISCELLANEOUS) IMPLANT
GLOVE BIO SURGEON STRL SZ 6.5 (GLOVE) ×2 IMPLANT
GLOVE BIO SURGEONS STRL SZ 6.5 (GLOVE) ×1
GLOVE BIOGEL PI IND STRL 7.0 (GLOVE) ×1 IMPLANT
GLOVE BIOGEL PI INDICATOR 7.0 (GLOVE) ×2
GOWN STRL REUS W/TWL LRG LVL3 (GOWN DISPOSABLE) ×6 IMPLANT
KIT ABG SYR 3ML LUER SLIP (SYRINGE) IMPLANT
NEEDLE HYPO 25X5/8 SAFETYGLIDE (NEEDLE) IMPLANT
NS IRRIG 1000ML POUR BTL (IV SOLUTION) ×3 IMPLANT
PACK C SECTION WH (CUSTOM PROCEDURE TRAY) ×3 IMPLANT
PAD OB MATERNITY 4.3X12.25 (PERSONAL CARE ITEMS) ×3 IMPLANT
PENCIL SMOKE EVAC W/HOLSTER (ELECTROSURGICAL) ×3 IMPLANT
RTRCTR C-SECT PINK 25CM LRG (MISCELLANEOUS) ×3 IMPLANT
STRIP CLOSURE SKIN 1/2X4 (GAUZE/BANDAGES/DRESSINGS) IMPLANT
SUT CHROMIC 1 CTX 36 (SUTURE) ×6 IMPLANT
SUT PLAIN 0 NONE (SUTURE) IMPLANT
SUT PLAIN 2 0 XLH (SUTURE) ×3 IMPLANT
SUT VIC AB 0 CT1 27 (SUTURE) ×4
SUT VIC AB 0 CT1 27XBRD ANBCTR (SUTURE) ×2 IMPLANT
SUT VIC AB 2-0 CT1 27 (SUTURE) ×2
SUT VIC AB 2-0 CT1 TAPERPNT 27 (SUTURE) ×1 IMPLANT
SUT VIC AB 3-0 CT1 27 (SUTURE)
SUT VIC AB 3-0 CT1 TAPERPNT 27 (SUTURE) IMPLANT
SUT VIC AB 4-0 KS 27 (SUTURE) ×3 IMPLANT
TOWEL OR 17X24 6PK STRL BLUE (TOWEL DISPOSABLE) ×3 IMPLANT
TRAY FOLEY BAG SILVER LF 14FR (SET/KITS/TRAYS/PACK) ×3 IMPLANT

## 2017-02-03 NOTE — Anesthesia Preprocedure Evaluation (Signed)
Anesthesia Evaluation  Patient identified by MRN, date of birth, ID band Patient awake    Reviewed: Allergy & Precautions, H&P , NPO status , Patient's Chart, lab work & pertinent test results  Airway Mallampati: II   Neck ROM: full    Dental   Pulmonary neg pulmonary ROS,    breath sounds clear to auscultation       Cardiovascular negative cardio ROS   Rhythm:regular Rate:Normal     Neuro/Psych  Headaches, PSYCHIATRIC DISORDERS Anxiety    GI/Hepatic   Endo/Other    Renal/GU      Musculoskeletal   Abdominal   Peds  Hematology   Anesthesia Other Findings   Reproductive/Obstetrics (+) Pregnancy                             Anesthesia Physical Anesthesia Plan  ASA: II  Anesthesia Plan: Spinal   Post-op Pain Management:    Induction:   PONV Risk Score and Plan: 2 and Treatment may vary due to age or medical condition  Airway Management Planned: Simple Face Mask  Additional Equipment:   Intra-op Plan:   Post-operative Plan:   Informed Consent: I have reviewed the patients History and Physical, chart, labs and discussed the procedure including the risks, benefits and alternatives for the proposed anesthesia with the patient or authorized representative who has indicated his/her understanding and acceptance.     Plan Discussed with: CRNA, Anesthesiologist and Surgeon  Anesthesia Plan Comments:         Anesthesia Quick Evaluation

## 2017-02-03 NOTE — Consult Note (Signed)
Delivery Note    Requested by Dr. Senaida Oresichardson to attend this repeat  C-section at  38 3/[redacted] weeks GA.   Born to a G3P2 mother with pregnancy complicated by anxiety and positive GBS with adequate treatment PTD.  SROM occurred PTD delivery with clear fluid. Infant with good spontaneous cry initially;delayed cord clamping performed x 1 minute.  Became dusky with decreased tone at  2 minutes of age.  Routine NRP followed including warming, drying and stimulation. Pulse oxygen saturation 77-80% Blow by oxygen given at approximately 5 minutes of age for 2-3 minutes for duskiness and decreased activity. Approximately 8 minutes of age the oxygen was weaned off and infant was centrally pink with oxygen saturations 95%. Apgars 7 / 8/ 9.  Physical exam within normal limits.  Left in OR for skin-to-skin contact with mother, in care of CN staff.  Care transferred to Pediatrician.  Ples SpecterWeaver, Nicole L MSN, NNP-BC

## 2017-02-03 NOTE — MAU Note (Signed)
Urine in lab 

## 2017-02-03 NOTE — Anesthesia Procedure Notes (Signed)
Spinal  Patient location during procedure: OR Start time: 02/03/2017 4:10 PM End time: 02/03/2017 4:14 PM Staffing Anesthesiologist: Chaney MallingHODIERNE, Ashleyanne Hemmingway Performed: anesthesiologist  Preanesthetic Checklist Completed: patient identified, surgical consent, pre-op evaluation, timeout performed, IV checked, risks and benefits discussed and monitors and equipment checked Spinal Block Patient position: sitting Prep: DuraPrep Patient monitoring: cardiac monitor, continuous pulse ox and blood pressure Approach: midline Location: L3-4 Injection technique: single-shot Needle Needle type: Pencan  Needle gauge: 24 G Needle length: 9 cm Assessment Sensory level: T10 Additional Notes Functioning IV was confirmed and monitors were applied. Sterile prep and drape, including hand hygiene and sterile gloves were used. The patient was positioned and the spine was prepped. The skin was anesthetized with lidocaine.  Free flow of clear CSF was obtained prior to injecting local anesthetic into the CSF.  The spinal needle aspirated freely following injection.  The needle was carefully withdrawn.  The patient tolerated the procedure well.

## 2017-02-03 NOTE — Transfer of Care (Signed)
Immediate Anesthesia Transfer of Care Note  Patient: Shelley Merritt  Procedure(s) Performed: Procedure(s): CESAREAN SECTION (N/A)  Patient Location: PACU  Anesthesia Type:Spinal  Level of Consciousness: awake  Airway & Oxygen Therapy: Patient Spontanous Breathing  Post-op Assessment: Report given to RN  Post vital signs: Reviewed and stable  Last Vitals:  Vitals:   02/03/17 1500 02/03/17 1503  BP:  (!) 110/57  Pulse: 94 94  Resp: 18   Temp: 36.8 C     Last Pain:  Vitals:   02/03/17 1500  TempSrc: Oral  PainSc: 2       Patients Stated Pain Goal: 4 (02/03/17 1102)  Complications: No apparent anesthesia complications

## 2017-02-03 NOTE — Discharge Instructions (Signed)
Try getting ORAJEL at the grocery store.  Apply this to your tooth.

## 2017-02-03 NOTE — ED Provider Notes (Signed)
[facility]EM[facility]ENCY DEPT Provider Note   CSN: [id number] Arrival date & time: 02/03/17  0407     History   Chief Complaint Chief Complaint  Patient presents with  . Dental Pain    HPI Shelley Merritt is a 26 y.o. female.  Patient presents to the emergency department with a dental complaint. Symptoms began to worsen tonight, but have been present intermittently for months.  She states that she needs her wisdom teeth removed. The patient has tried to alleviate pain with Tylenol.  Pain rated at a 10/10, characterized as throbbing in nature and located left lower rear wisdom tooth. Patient denies fever, night sweats, chills, difficulty swallowing or opening mouth, SOB, nuchal rigidity or decreased ROM of neck.  Patient does not have a dentist and requests a resource guide at discharge.    The history is provided by the patient. No language interpreter was used.    Past Medical History:  Diagnosis Date  . Anxiety    Panic Attacks    There are no active problems to display for this patient.   Past Surgical History:  Procedure Laterality Date  . CESAREAN SECTION  09/28/2011   Procedure: CESAREAN SECTION;  Surgeon: Bing Plumehomas F Henley, MD;  Location: [facility];  Service: Gynecology;  Laterality: N/A;  Primary cesarean section with delivery of baby girl at  [id number]. Apgars 9/10.    OB History    Gravida Para Term Preterm AB Living   2 1 1     1    SAB TAB Ectopic Multiple Live Births           1       Home Medications    Prior to Admission medications   Not on File    Family History No family history on file.  Social History Social History  Substance Use Topics  . Smoking status: Never Smoker  . Smokeless tobacco: Never Used  . Alcohol use No     Allergies   Latex   Review of Systems Review of Systems  Constitutional: Negative for chills and fever.  HENT: Positive for dental problem. Negative for drooling.   Respiratory: Negative for shortness of breath.     Cardiovascular: Negative for chest pain.  Gastrointestinal: Negative for constipation, diarrhea, nausea and vomiting.  Genitourinary: Negative for dysuria.  Neurological: Negative for speech difficulty.  Psychiatric/Behavioral: Positive for sleep disturbance.  All other systems reviewed and are negative.    Physical Exam Updated Vital Signs BP 105/60 (BP Location: Right Arm)   Pulse (!) 108   Temp 99.1 F (37.3 C) (Oral)   Resp 18   Ht 5\' 2"  (1.575 m)   Wt 74 kg (163 lb 4 oz)   SpO2 100%   BMI 29.86 kg/m   Physical Exam Physical Exam  Constitutional: Pt appears well-developed and well-nourished.  HENT:  Head: Normocephalic.  Right Ear: Tympanic membrane, external ear and ear canal normal.  Left Ear: Tympanic membrane, external ear and ear canal normal.  Nose: Nose normal. Right sinus exhibits no maxillary sinus tenderness and no frontal sinus tenderness. Left sinus exhibits no maxillary sinus tenderness and no frontal sinus tenderness.  Mouth/Throat: Uvula is midline, oropharynx is clear and moist and mucous membranes are normal. No oral lesions. No uvula swelling or lacerations. No oropharyngeal exudate, posterior oropharyngeal edema, posterior oropharyngeal erythema or tonsillar abscesses.  Poor dentition No gingival swelling, fluctuance or induration No gross abscess  No sublingual edema, tenderness to palpation, or sign of Ludwig's angina, or  deep space infection Pain at left lower rear molar remarkable for caries and crack Eyes: Conjunctivae are normal. Pupils are equal, round, and reactive to light. Right eye exhibits no discharge. Left eye exhibits no discharge.  Neck: Normal range of motion. Neck supple.  No stridor Handling secretions without difficulty No nuchal rigidity No cervical lymphadenopathy Cardiovascular: Normal rate, regular rhythm and normal heart sounds.   Pulmonary/Chest: Effort normal. No respiratory distress.  Equal chest rise  Abdominal: Soft.  Bowel sounds are normal. Pt exhibits no distension. There is no tenderness.  Lymphadenopathy: Pt has no cervical adenopathy.  Neurological: Pt is alert and oriented x 4  Skin: Skin is warm and dry.  Psychiatric: Pt has a normal mood and affect.  Nursing note and vitals reviewed.    ED Treatments / Results  Labs (all labs ordered are listed, but only abnormal results are displayed) Labs Reviewed - No data to display  EKG  EKG Interpretation None       Radiology No results found.  Procedures Procedures (including critical care time)  Medications Ordered in ED Medications - No data to display   Initial Impression / Assessment and Plan / ED Course  I have reviewed the triage vital signs and the nursing notes.  Pertinent labs & imaging results that were available during my care of the patient were reviewed by me and considered in my medical decision making (see chart for details).     Patient with dentalgia.  No abscess requiring immediate incision and drainage.  Exam not concerning for Ludwig's angina or pharyngeal abscess.  Will treat with Orajel, no evidence of infection. Pt instructed to follow-up with dentist.  Discussed return precautions. Pt safe for discharge.   Final Clinical Impressions(s) / ED Diagnoses   Final diagnoses:  Pain, dental    New Prescriptions New Prescriptions   No medications on file     Roxy Horseman, Cordelia Merritt 02/03/17 1610    Ward, Shelley Maw, DO 02/03/17 [phone]

## 2017-02-03 NOTE — ED Triage Notes (Signed)
Reports tooth on left bottom side started hurting when it was time to go to bed.  Due to have wisdom teeth out.  Also scheduled for c-section on Friday.  Took tylenol 500mg  at 0300.

## 2017-02-03 NOTE — Op Note (Signed)
Operative Note    Preoperative Diagnosis Term pregnancy at 38 3/7 weeks Spontaneous rupture of Membranes  Postoperative Diagnosis Same  Procedure Repeat low transverse c-section with 2 layer closure of uterus  Surgeon Huel CoteKathy Harrison Zetina, MD  Anesthesia Spinal  Fluids: EBL 850mL UOP 200mL clear IVF 2200mL LR  Findings A viable female infant, vertex  Apgars 7.8.9.  Nuchal x 1 reduced.   Normal uterus ovaries and tubes  Specimen   Procedure Note Patient was taken to the operating room where spinal anesthesia was obtained and found to be adequate by Allis clamp test. She was prepped and draped in the normal sterile fashion in the dorsal supine position with a leftward tilt. An appropriate time out was performed. A Pfannenstiel skin incision was then made through a pre-existing scar with the scalpel and carried through to the underlying layer of fascia by sharp dissection and Bovie cautery. The fascia was nicked in the midline and the incision was extended laterally with Mayo scissors. The inferior aspect of the incision was grasped Coker clamps and dissected off the underlying rectus muscles. In a similar fashion the superior aspect was dissected off the rectus muscles. Rectus muscles were separated in the midline and the peritoneal cavity entered bluntly. The peritoneal incision was then extended both superiorly and inferiorly with careful attention to avoid both bowel and bladder. The Alexis self-retaining wound retractor was then placed within the incision and the lower uterine segment exposed. The bladder flap was developed with Metzenbaum scissors and pushed away from the lower uterine segment. The lower uterine segment was then incised in a transverse fashion and the cavity itself entered bluntly. The incision was extended bluntly. The infant's head was then lifted and delivered from the incision without difficulty. The remainder of the infant delivered and the nose and mouth bulb  suctioned with the cord clamped and cut as well. The infant was handed off to the waiting pediatricians. The placenta was then spontaneously expressed from the uterus and the uterus cleared of all clots and debris with moist lap sponge. The uterine incision was then repaired in 2 layers the first layer was a running locked layer of 1-0 chromic and the second an imbricating layer of the same suture. The tubes and ovaries were inspected and the gutters cleared of all clots and debris. The uterine incision was inspected and found to be hemostatic. All instruments and sponges as well as the Alexis retractor were then removed from the abdomen. The rectus muscles and peritoneum were then reapproximated with a running suture of 2-0 Vicryl. The fascia was then closed with 0 Vicryl in a running fashion. Subcutaneous tissue was reapproximated with 3-0 plain in a running fashion. The skin was closed with a subcuticular stitch of 4-0 Vicryl on a Keith needle and then reinforced with benzoin and Steri-Strips. At the conclusion of the procedure all instruments and sponge counts were correct. Patient was taken to the recovery room in good condition with her baby accompanying her skin to skin.

## 2017-02-03 NOTE — H&P (Signed)
Shelley Merritt is a 26 y.o. female G3P1011 at 37 3/7 weeks (EDD 02/14/17 by 12 week [place]) presenting for c/o SROM this AM at 0700.  She is having minimal contractions.  Prenatal care has been significant for anxiety and is treated with seroquel.  At times has had inappropriate behavior, and sometimes hard to stay on task in conversations.  She is GBS positive.  OB History    Gravida Para Term Preterm AB Living   3 1 1   1 1    SAB TAB Ectopic Multiple Live Births     1     1    LTCS 2013 Arrest of dilation 6#2oz EAB 2015  Past Medical History:  Diagnosis Date  . Anemia   . Anxiety    Panic Attacks, on meds  . Eczema   . Headache   . Infection    UTI   Past Surgical History:  Procedure Laterality Date  . CESAREAN SECTION  09/28/2011   Procedure: CESAREAN SECTION;  Surgeon: Shelley Plume, MD;  Location: [facility];  Service: Gynecology;  Laterality: N/A;  Primary cesarean section with delivery of baby girl at  26. Apgars 9/10.  Marland Kitchen MOUTH SURGERY     Family History: family history includes Arthritis in her mother; Diabetes in her father, paternal grandfather, and paternal grandmother; Heart disease in her paternal grandmother; Hypertension in her paternal grandmother. Social History:  reports that she has never smoked. She has never used smokeless tobacco. She reports that she does not drink alcohol or use drugs.     Maternal Diabetes: No Genetic Screening: Normal Maternal Ultrasounds/Referrals: Normal Fetal Ultrasounds or other Referrals:  None Maternal Substance Abuse:  No Significant Maternal Medications:  Meds include: Other:   Seroquel Significant Maternal Lab Results:  Lab values include: Group B Strep positive Other Comments:  None  Review of Systems  Gastrointestinal: Negative for abdominal pain.  Psychiatric/Behavioral: The patient is nervous/anxious.    Maternal Medical History:  Reason for admission: Rupture of membranes.   Contractions: Onset was 1-2 hours ago.    Frequency: irregular.   Perceived severity is mild.    Fetal activity: Perceived fetal activity is normal.    Prenatal Complications - Diabetes: none.    Dilation: Closed Exam by:: Dr. Senaida Merritt Blood pressure 130/79, pulse (!) 113, temperature 98.3 F (36.8 C), temperature source Oral, resp. rate 20. Maternal Exam:  Uterine Assessment: Contraction strength is mild.  Contraction frequency is irregular.   Abdomen: Patient reports no abdominal tenderness. Surgical scars: low transverse.   Introitus: Normal vulva. Normal vagina.  Ferning test: positive.   Pelvis: questionable for delivery.      Physical Exam  Constitutional: She is oriented to person, place, and time. She appears well-developed.  Cardiovascular: Normal rate and regular rhythm.   Respiratory: Effort normal.  GI: Soft.  Neurological: She is alert and oriented to person, place, and time.  Psychiatric:  Scattered thought content in conversation    Prenatal labs: ABO, Rh: --/--/A POS (07/23 0750) Antibody: NEG (07/23 0750) Rubella: Immune (01/18 0000) RPR: Nonreactive (01/18 0000)  HBsAg: Negative (01/18 0000)  HIV: Non-reactive (01/18 0000)  GBS: Positive (07/03 0000)  First trimester screen WNL One hour GCT WNL  Assessment/Plan: Pt for repeat c-section with SROM but no labor.  C/o toothache.  Will have to proceed with c-section at 400pm given she ate 2 bowls of cereal at 0700am.  Anesthesia aware and will meet pt.  Will monitor her given VBAC and  ruptured.  Give Ancef for GBS and prophylaxis   Oliver PilaKathy W [name] 02/03/2017, 10:11 AM

## 2017-02-03 NOTE — ED Notes (Signed)
ED Provider at bedside. 

## 2017-02-03 NOTE — ED Notes (Signed)
Lolli-caine applied to pt's back left tooth.

## 2017-02-03 NOTE — Anesthesia Postprocedure Evaluation (Signed)
Anesthesia Post Note  Patient: Shelley Merritt  Procedure(s) Performed: Procedure(s) (LRB): CESAREAN SECTION (N/A)     Patient location during evaluation: PACU Anesthesia Type: Spinal Level of consciousness: oriented and awake and alert Pain management: pain level controlled Vital Signs Assessment: post-procedure vital signs reviewed and stable Respiratory status: spontaneous breathing, respiratory function stable and nonlabored ventilation Cardiovascular status: blood pressure returned to baseline and stable Postop Assessment: no headache, no backache, spinal receding, patient able to bend at knees and no signs of nausea or vomiting Anesthetic complications: no    Last Vitals:  Vitals:   02/03/17 1900 02/03/17 1915  BP: 119/71 (!) 134/59  Pulse: 77 64  Resp: 15 20  Temp:      Last Pain:  Vitals:   02/03/17 1830  TempSrc: Oral  PainSc: 0-No pain   Pain Goal: Patients Stated Pain Goal: 4 (02/03/17 1102)               Kaysen Deal A.

## 2017-02-03 NOTE — MAU Note (Signed)
"  it's broke", reports started leaking when she was eating her 2nd bowl of cereal around 0650.

## 2017-02-04 ENCOUNTER — Encounter (HOSPITAL_COMMUNITY): Payer: Self-pay | Admitting: *Deleted

## 2017-02-04 LAB — RPR: RPR Ser Ql: NONREACTIVE

## 2017-02-04 LAB — CBC
HCT: 36.9 % (ref 36.0–46.0)
Hemoglobin: 11.7 g/dL — ABNORMAL LOW (ref 12.0–15.0)
MCH: 27 pg (ref 26.0–34.0)
MCHC: 31.7 g/dL (ref 30.0–36.0)
MCV: 85.2 fL (ref 78.0–100.0)
PLATELETS: 159 10*3/uL (ref 150–400)
RBC: 4.33 MIL/uL (ref 3.87–5.11)
RDW: 17.5 % — AB (ref 11.5–15.5)
WBC: 10.9 10*3/uL — ABNORMAL HIGH (ref 4.0–10.5)

## 2017-02-04 MED ORDER — QUETIAPINE FUMARATE ER 50 MG PO TB24
50.0000 mg | ORAL_TABLET | Freq: Every day | ORAL | Status: DC
  Administered 2017-02-04 – 2017-02-05 (×2): 50 mg via ORAL
  Filled 2017-02-04 (×4): qty 1

## 2017-02-04 NOTE — Addendum Note (Signed)
Addendum  created 02/04/17 0745 by Junious SilkGilbert, Suman Trivedi, CRNA   Sign clinical note

## 2017-02-04 NOTE — Progress Notes (Signed)
Subjective: Postpartum Day #1: Cesarean Delivery Patient reports tolerating PO.    Objective: Vital signs in last 24 hours: Temp:  [97.6 F (36.4 C)-98.7 F (37.1 C)] 98 F (36.7 C) (07/24 0424) Pulse Rate:  [64-114] 67 (07/24 0424) Resp:  [15-20] 18 (07/24 0424) BP: (93-163)/(42-148) 93/42 (07/24 0424) SpO2:  [92 %-100 %] 98 % (07/24 0424) Weight:  [73.9 kg (163 lb)] 73.9 kg (163 lb) (07/23 1102)  Physical Exam:  General: alert Lochia: appropriate Uterine Fundus: firm Incision: dressing C/D/I   Recent Labs  02/03/17 0806 02/04/17 0456  HGB 12.1 11.7*  HCT 37.1 36.9    Assessment/Plan: Status post Cesarean section. Doing well postoperatively.  Continue current care, ambulate.  Leighton Roachodd D Aldahir Litaker 02/04/2017, 7:48 AM

## 2017-02-04 NOTE — Progress Notes (Signed)
Patient stated talked to a friend Banker(RN) in [place] who told patient that she was dying of a blood clot and needed emergent help.  Within a few minutes, this concern was dropped and patient was onto another concern of not being able to walk. Patient is disorganized in her thinking, unable to carry on a normal conversation with staff, and unable to stay focused on her and her baby's care needs.  MD notified, CSW notified (will see as soon as possible), and Psychiatric consult made.  Patient currently with family members close to desk.  Plan to continue to monitor patient frequently and decrease patient's anxiety level as much as possible.

## 2017-02-04 NOTE — Lactation Note (Signed)
This note was copied from a baby's chart. Lactation Consultation Note: Mother reports that this baby is taking to the breast and the bottle. She plans to breast and bottle feed. Mother reports that she breastfed her first child for 2 months until her nipples became scabbed. Mother declines need for assistance. Lactation brochure given to mother with explanation of all LC services. Reviewed basics. Advised mother to cue feed infant and feed at least 8-12 times in 24 hours. [facility].  Patient Name: Shelley Merritt ZOXWR'UToday's Date: 02/04/2017 Reason for consult: Initial assessment   Maternal Data Has patient been taught Hand Expression?: Yes Does the patient have breastfeeding experience prior to this delivery?: Yes  Feeding    LATCH Score/Interventions                      Lactation Tools Discussed/Used     Consult Status Consult Status: Follow-up Date: 02/05/17 Follow-up type: In-patient    Stevan BornKendrick, Cresencia Asmus [facility] 02/04/2017, 1:24 PM

## 2017-02-04 NOTE — Progress Notes (Signed)
Patient stated she had spoken with cousin in [place] who is an Charity fundraiserN. She was concerned she had blood clot, and too much swelling in her legs. Patient was concerned for her well being and requested immediate blood pressure be taken which resulted in 110/55. Patient was still upset assured her that it was indeed a normal pressure. Then patient became concerned about no one helping her to the bathroom I then helped the patient to the side of the bed where at this point she wanted dad to help her into the restroom. She was very anxious and continually stated that she was unable to move due to her back I encouraged for several minutes to help her move to the side of the bed. Which was eventually successful. She than began to explain to me she was anxious for me to complete hearing screen due to me possibly kidnapping the baby. She agreed for me to do the screening after I explained in detail what was going to happen. She then told the father to stand over me while I did it to make sure I could not run away with the baby. I tried to encourage and coach the patient to help lower the level of anxiety.

## 2017-02-04 NOTE — Progress Notes (Signed)
CSW notified by RN of concerning behaviors and thought processes displayed by MOB.  CSW intends to meet with patient to offer support and complete assessment for Anxiety, but is not available at this moment.  Psychosocial assessment to be completed prior to discharge. 

## 2017-02-04 NOTE — Anesthesia Postprocedure Evaluation (Signed)
Anesthesia Post Note  Patient: Shelley Merritt  Procedure(s) Performed: Procedure(s) (LRB): CESAREAN SECTION (N/A)     Patient location during evaluation: Mother Baby Anesthesia Type: Spinal Level of consciousness: oriented and awake and alert Pain management: pain level controlled Vital Signs Assessment: post-procedure vital signs reviewed and stable Respiratory status: spontaneous breathing and respiratory function stable Cardiovascular status: blood pressure returned to baseline and stable Postop Assessment: no headache and no backache Anesthetic complications: no    Last Vitals:  Vitals:   02/04/17 0035 02/04/17 0424  BP: (!) 101/52 (!) 93/42  Pulse: 72 67  Resp: 18 18  Temp: 36.6 C 36.7 C    Last Pain:  Vitals:   02/04/17 0424  TempSrc: Oral  PainSc: 0-No pain   Pain Goal: Patients Stated Pain Goal: 4 (02/03/17 1102)               Junious SilkGILBERT,Averill Winters

## 2017-02-04 NOTE — Plan of Care (Signed)
Problem: Coping: Goal: Ability to identify and utilize available resources and services will improve Outcome: Progressing Psychiatric consult made

## 2017-02-05 NOTE — Progress Notes (Signed)
Subjective: Postpartum Day 2: Cesarean Delivery Patient reports incisional pain, tolerating PO and no problems voiding.  Some psych issues.  [place] [facility] [phone].  Was in [place].  Per pt has anxiety, wants off the meds.  Will contact psych.    Objective: Vital signs in last 24 hours: Temp:  [97.7 F (36.5 C)-98.3 F (36.8 C)] 97.7 F (36.5 C) (07/25 0636) Pulse Rate:  [69-78] 70 (07/25 0636) Resp:  [14-19] 16 (07/25 0636) BP: (101-107)/(57-67) 104/67 (07/25 0636) SpO2:  [99 %] 99 % (07/24 1230)  Physical Exam:  General: alert and no distress Lochia: appropriate Uterine Fundus: firm Incision: healing well DVT Evaluation: No evidence of DVT seen on physical exam.   Recent Labs  02/03/17 0806 02/04/17 0456  HGB 12.1 11.7*  HCT 37.1 36.9    Assessment/Plan: Status post Cesarean section. Doing well postoperatively.  Continue current care. Psychiatry consult called yesterday as well as Social Work  [employer] CorporationJody Bovard-Stuckert 02/05/2017, 7:01 AM

## 2017-02-06 ENCOUNTER — Encounter (HOSPITAL_COMMUNITY): Admission: RE | Admit: 2017-02-06 | Payer: Medicaid Other | Source: Ambulatory Visit

## 2017-02-06 MED ORDER — QUETIAPINE FUMARATE ER 50 MG PO TB24: 50.0000 mg | ORAL_TABLET | Freq: Every day | ORAL | Status: DC

## 2017-02-06 MED ORDER — QUETIAPINE FUMARATE ER 50 MG PO TB24: 50.0000 mg | ORAL_TABLET | Freq: Every day | ORAL | 3 refills | Status: DC

## 2017-02-06 MED ORDER — IBUPROFEN 600 MG PO TABS: 600.0000 mg | ORAL_TABLET | Freq: Four times a day (QID) | ORAL | 0 refills | Status: DC

## 2017-02-06 NOTE — Discharge Summary (Signed)
OB Discharge Summary     Patient Name: Shelley Merritt DOB: 27-Mar-1991 MRN: [id number]  Date of admission: 02/03/2017 Delivering MD: Huel CoteICHARDSON, KATHY   Date of discharge: 02/06/2017  Admitting diagnosis: EMS Intrauterine pregnancy: 3869w3d     Secondary diagnosis:  Active Problems:   S/P repeat low transverse C-section      Discharge diagnosis: Term Pregnancy Delivered                                  Hospital course:  Onset of Labor With Unplanned C/S  26 y.o. yo G3P2011 at 7769w3d was admitted in Latent Labor, PROM on 02/03/2017. Membrane Rupture Time/Date: 6:50 AM ,02/03/2017   The patient went for cesarean section due to Elective Repeat, and delivered a Viable infant,02/03/2017  Details of operation can be found in separate operative note. Patient had an uncomplicated postpartum course except for anxiety which improved on Seroquel.  She is ambulating,tolerating a regular diet, passing flatus, and urinating well.  Patient is discharged home in stable condition 02/06/17.  Physical exam  Vitals:   02/04/17 1747 02/05/17 0636 02/05/17 1902 02/06/17 0631  BP: (!) 101/57 104/67 114/60 116/68  Pulse: 78 70 91 88  Resp: 14 16 18    Temp: 98.3 F (36.8 C) 97.7 F (36.5 C) 98.4 F (36.9 C) 97.7 F (36.5 C)  TempSrc: Oral Oral Oral   SpO2:      Weight:      Height:       General: alert Lochia: appropriate Uterine Fundus: firm Incision: Healing well with no significant drainage  Labs: Lab Results  Component Value Date   WBC 10.9 (H) 02/04/2017   HGB 11.7 (L) 02/04/2017   HCT 36.9 02/04/2017   MCV 85.2 02/04/2017   PLT 159 02/04/2017   CMP 12/22/2007  Glucose 81  BUN 9  Creatinine 0.60  Sodium 138  Potassium 3.6  Chloride 106  CO2 21  Calcium 9.5  Total Protein 7.8  Total Bilirubin 0.6  Alkaline Phos 61  AST 18  ALT 13    Discharge instruction: per After Visit Summary and "Baby and Me Booklet".  After visit meds:  Allergies as of 02/06/2017      Reactions   Latex Rash      Medication List    TAKE these medications   acetaminophen 500 MG tablet Commonly known as:  TYLENOL Take 500 mg by mouth every 6 (six) hours as needed.   ferrous sulfate 325 (65 FE) MG tablet Take 325 mg by mouth daily with breakfast.   ibuprofen 600 MG tablet Commonly known as:  ADVIL,MOTRIN Take 1 tablet (600 mg total) by mouth every 6 (six) hours.   prenatal multivitamin Tabs tablet Take 1 tablet by mouth daily at 12 noon.   SEROQUEL XR 50 MG Tb24 24 hr tablet Generic drug:  QUEtiapine Seroquel XR 50 mg tablet,extended release  TK 1 T PO QD What changed:  Another medication with the same name was added. Make sure you understand how and when to take each.   QUEtiapine 50 MG Tb24 24 hr tablet Commonly known as:  SEROQUEL XR Take 1 tablet (50 mg total) by mouth daily. What changed:  You were already taking a medication with the same name, and this prescription was added. Make sure you understand how and when to take each.   QUEtiapine 50 MG Tb24 24 hr tablet Commonly known as:  SEROQUEL  XR Take 1 tablet (50 mg total) by mouth at bedtime. What changed:  You were already taking a medication with the same name, and this prescription was added. Make sure you understand how and when to take each.       Diet: routine diet  Activity: Advance as tolerated. Pelvic rest for 6 weeks.   Outpatient follow up:2 weeks  Newborn Data: Live born female  Birth Weight: 8 lb 2.9 oz (3710 g) APGAR: 7, 8  Baby Feeding: Bottle Disposition:home with mother   02/06/2017 Zenaida Nieceodd D Tachina Spoonemore, MD

## 2017-02-06 NOTE — Progress Notes (Signed)
Spoke with Dr Jackelyn KnifeMeisinger on the phone to discuss order for psychiatry consult. Per Dr Jackelyn KnifeMeisinger, the pt will F/U with psychiatry outpt and order given to cancel order for inpatient consult.

## 2017-02-06 NOTE — Progress Notes (Signed)
Patient ID: Shelley LaniusJessica L Dragan, female   DOB: 02/25/1991, 26 y.o.   MRN: [id number] POD #3 Doing ok, back sore, some swelling in feet, wants to go home today Afeb, VSS Abd- soft, fundus firm, incision intact D/c home, continue Seroquel

## 2017-02-06 NOTE — Clinical Social Work Maternal (Signed)
CLINICAL SOCIAL WORK MATERNAL/CHILD NOTE  Patient Details  Name: TOYNA ERISMAN MRN: [id number] Date of Birth: 1990-09-02  Date:  02/06/2017  Clinical Social Worker Initiating Note:  Ferdinand Lango Chrishana Spargur, MSW, LCSW-A  Date/ Time Initiated:  02/06/17/1008     Child's Name:  Unknown to this Probation officer at this time (MOB did not provide a name)    Legal Guardian:  Other (Comment) (Not established by court system; MOB and FOB Dwana Melena) parent together in same house)   Need for Interpreter:  None   Date of Referral:  02/04/17     Reason for Referral:  Other (Comment) (Hx of anxiety and PPD)   Referral Source:  RN   Address:  [place], [address] [address]  Phone number:  [phone]   Household Members:  Self, Significant Other, Minor Children   Natural Supports (not living in the home):  Extended Family, Friends (Identifies an aunt and MGM as supprots not livingin the home )   Professional Supports: Therapist   Employment: Unemployed   Type of Work: MOB unemployed currently    Education:  Database administrator Resources:  [employer]   Other Resources:  [employer], Physicist, medical    Cultural/Religious Considerations Which May Impact Care:  Non-denominational   Strengths:  Ability to meet basic needs , Compliance with medical plan , Home prepared for child    Risk Factors/Current Problems:  Mental Health Concerns  (HX of anxiety, PPD)   Cognitive State:  Able to Concentrate , Alert    Mood/Affect:  Calm , Interested    CSW Assessment: CSW met with MOB at bedside to complete assessment for consult regarding hx of anxiety and PPD. Upon this writers arrival, MOB was asleep in the bed with baby in bed with her. MOB was easily awaken and reminded MOB of safe sleeping habits and SIDS pre-cautions. MOB noted she is up now and does not need to move baby to the basinet. This Probation officer explained role and reasoning for visit. MOB verbalizes understanding and expresses its  nice to meet this Probation officer. CSW inquired about hx of anxiety. MOB notes she has suffered from anxiety majority of her life but it is situational. She notes some situations make her more anxious than others. MOB notes she see's a therapist who is really great that helps her cope with her anxiety. CSW inquired which practice and/or clinic her therapist was with. MOB was unable to inform this Probation officer of which one. CSW encouraged MOB to continue seeing her therapist upon d/c from the hospital since she identifies her therapist as a good support. MOB notes she will continue to go. CSW inquired about PPD hx. MOB grew notably angry at this question noting "dont listen to anything he says. I have never had PPD". CSW inquired who MOB was referring to when she said "he". MOB notes she is talking about FOB. This writer reassured MOB that FOB has not informed this Probation officer of PPD hx but instead [facility] records did. MOB was notably more calm and verbalized understanding. This Probation officer asked MOB what her symptoms were after she gave birth to her daughter. MOB notes her daughter is autistic so in the beginning it was difficult and she lost her temper on a physician after giving birth but notes she never had PPD. This Probation officer educated MOB on PPD signs and symptoms. CSW discussed baby blues vs PMAD's and encouraged her to seek medical attention if she feels any symptoms onset. This Probation officer  Provided MOB with Post-Partum new born checklist and encouraged MOB to check in with herself upon d/c home regularly. MOB was thankful for resources and verbalized understanding.   MOB was supposed to be seen by telepsych; however, this writer was informed that MOB's consult for telepsych has been cancelled and it is recommended she follow-up outpatient. This Probation officer provided MOB with resources to other outpatient clinics in the event her current therapist is unable to assess/treat for other behavioral health concerns. MOB was thankful and noted she will  continue to see current therapist since she has a relationship with her and likes her a lot but will keep the others in mind in the event things change.   At this time, MOB noted no further needs/ concerns. CSW identifies no barriers to d/c at this time.   CSW Plan/Description:  Information/Referral to [employer] , [employer] , No Further Intervention Required/No Barriers to Discharge   [employer], MSW, [facility]  Office: [phone]

## 2017-02-06 NOTE — Discharge Instructions (Signed)
As per discharge pamphlet She needs to see her psychiatric provider ASAP after discharge

## 2017-02-07 ENCOUNTER — Inpatient Hospital Stay (HOSPITAL_COMMUNITY)
Admission: RE | Admit: 2017-02-07 | Payer: Medicaid Other | Source: Ambulatory Visit | Admitting: Obstetrics and Gynecology

## 2017-02-07 SURGERY — Surgical Case
Anesthesia: Regional

## 2017-02-08 ENCOUNTER — Emergency Department (HOSPITAL_COMMUNITY): Payer: Medicaid Other

## 2017-02-08 ENCOUNTER — Encounter (HOSPITAL_COMMUNITY): Payer: Self-pay | Admitting: Emergency Medicine

## 2017-02-08 ENCOUNTER — Emergency Department (HOSPITAL_COMMUNITY)
Admission: EM | Admit: 2017-02-08 | Discharge: 2017-02-08 | Disposition: A | Discharge status: Home / Self Care | Payer: Medicaid Other | Attending: Emergency Medicine | Admitting: Emergency Medicine

## 2017-02-08 DIAGNOSIS — R2243 Localized swelling, mass and lump, lower limb, bilateral: Secondary | ICD-10-CM | POA: Insufficient documentation

## 2017-02-08 DIAGNOSIS — Z91040 Latex allergy status: Secondary | ICD-10-CM | POA: Insufficient documentation

## 2017-02-08 DIAGNOSIS — Z79899 Other long term (current) drug therapy: Secondary | ICD-10-CM | POA: Insufficient documentation

## 2017-02-08 DIAGNOSIS — G510 Bell's palsy: Secondary | ICD-10-CM

## 2017-02-08 DIAGNOSIS — R29810 Facial weakness: Secondary | ICD-10-CM | POA: Diagnosis present

## 2017-02-08 LAB — CBC WITH DIFFERENTIAL/PLATELET
BASOS PCT: 0 %
Basophils Absolute: 0 10*3/uL (ref 0.0–0.1)
Eosinophils Absolute: 0.1 10*3/uL (ref 0.0–0.7)
Eosinophils Relative: 1 %
HEMATOCRIT: 36.9 % (ref 36.0–46.0)
HEMOGLOBIN: 12 g/dL (ref 12.0–15.0)
LYMPHS PCT: 14 %
Lymphs Abs: 1.2 10*3/uL (ref 0.7–4.0)
MCH: 27 pg (ref 26.0–34.0)
MCHC: 32.5 g/dL (ref 30.0–36.0)
MCV: 82.9 fL (ref 78.0–100.0)
MONOS PCT: 6 %
Monocytes Absolute: 0.5 10*3/uL (ref 0.1–1.0)
NEUTROS ABS: 6.6 10*3/uL (ref 1.7–7.7)
NEUTROS PCT: 79 %
Platelets: 241 10*3/uL (ref 150–400)
RBC: 4.45 MIL/uL (ref 3.87–5.11)
RDW: 16.5 % — ABNORMAL HIGH (ref 11.5–15.5)
WBC: 8.4 10*3/uL (ref 4.0–10.5)

## 2017-02-08 LAB — COMPREHENSIVE METABOLIC PANEL
ALBUMIN: 2.7 g/dL — AB (ref 3.5–5.0)
ALK PHOS: 99 U/L (ref 38–126)
ALT: 41 U/L (ref 14–54)
AST: 39 U/L (ref 15–41)
Anion gap: 8 (ref 5–15)
BILIRUBIN TOTAL: 0.5 mg/dL (ref 0.3–1.2)
CALCIUM: 8.5 mg/dL — AB (ref 8.9–10.3)
CO2: 23 mmol/L (ref 22–32)
Chloride: 105 mmol/L (ref 101–111)
Creatinine, Ser: 0.53 mg/dL (ref 0.44–1.00)
GFR calc Af Amer: >60 mL/min (ref >60)
GFR calc non Af Amer: >60 mL/min (ref >60)
GLUCOSE: 77 mg/dL (ref 65–99)
Potassium: 3.7 mmol/L (ref 3.5–5.1)
Sodium: 136 mmol/L (ref 135–145)
TOTAL PROTEIN: 6.1 g/dL — AB (ref 6.5–8.1)

## 2017-02-08 MED ORDER — PREDNISONE 20 MG PO TABS
60.0000 mg | ORAL_TABLET | Freq: Once | ORAL | Status: AC
  Administered 2017-02-08: 60 mg via ORAL
  Filled 2017-02-08: qty 3

## 2017-02-08 MED ORDER — PREDNISONE 20 MG PO TABS: 60.0000 mg | ORAL_TABLET | Freq: Every day | ORAL | 0 refills | Status: AC

## 2017-02-08 NOTE — Discharge Instructions (Signed)
Please read instructions below. Begin taking the prednisone, once per day until it is gone. Schedule an appointment with your primary care provider to follow up on today's visit.  You have been given a neurology referral. You can schedule an appointment with them if symptoms persist.  Return to the ER for new or concerning symptoms.

## 2017-02-08 NOTE — ED Provider Notes (Signed)
[facility]-EMERGENCY DEPT Provider Note   CSN: [id number] Arrival date & time: 02/08/17  1103     History   Chief Complaint Chief Complaint  Patient presents with  . Leg Swelling  . Facial Droop    HPI Shelley Merritt is a 26 y.o. female  HPI Pt s/p c-section on 02/03/2017, presenting with acute onset of right sided facial droop that began on Thursday after discharge. She states she has a "weird" sensation to left side of her face. Per chart review, she had an uncomplicated post-partum recovery with the exception of anxiety. She reports assoc b/l lower leg swelling since delivery. She denies SOB, CP, unilateral leg pain, headache, dizziness, confusion, F/C. She denies hx of DVT.     Past Medical History:  Diagnosis Date  . Anemia   . Anxiety    Panic Attacks, on meds  . Eczema   . Headache   . Infection    UTI    Patient Active Problem List   Diagnosis Date Noted  . S/P repeat low transverse C-section 02/03/2017    Past Surgical History:  Procedure Laterality Date  . CESAREAN SECTION  09/28/2011   Procedure: CESAREAN SECTION;  Surgeon: Bing Plume, MD;  Location: [facility] OR[facility];  Service: Gynecology;  Laterality: N/A;  Primary cesarean section with delivery of baby girl at  63. Apgars 9/10.  Marland Kitchen CESAREAN SECTION N/A 02/03/2017   Procedure: CESAREAN SECTION;  Surgeon: Huel Cote, MD;  Location: [facility] BIRTHING SUITES;  Service: Obstetrics;  Laterality: N/A;  . MOUTH SURGERY      OB History    Gravida Para Term Preterm AB Living   3 2 2   1 1    SAB TAB Ectopic Multiple Live Births     1   0 1       Home Medications    Prior to Admission medications   Medication Sig Start Date End Date Taking? Authorizing Provider  acetaminophen (TYLENOL) 500 MG tablet Take 500 mg by mouth every 6 (six) hours as needed.    [provider]  ferrous sulfate 325 (65 FE) MG tablet Take 325 mg by mouth daily with breakfast.    [provider]  ibuprofen (ADVIL,MOTRIN)  600 MG tablet Take 1 tablet (600 mg total) by mouth every 6 (six) hours. 02/06/17   Meisinger, Tawanna Cooler, MD  predniSONE (DELTASONE) 20 MG tablet Take 3 tablets (60 mg total) by mouth daily with breakfast. 02/08/17 02/15/17  Russo, [place] N, PA-C  Prenatal Vit-Fe Fumarate-FA (PRENATAL MULTIVITAMIN) TABS tablet Take 1 tablet by mouth daily at 12 noon.    [provider]  QUEtiapine (SEROQUEL XR) 50 MG TB24 24 hr tablet Seroquel XR 50 mg tablet,extended release  TK 1 T PO QD    [provider]  QUEtiapine (SEROQUEL XR) 50 MG TB24 24 hr tablet Take 1 tablet (50 mg total) by mouth daily. 02/06/17   Meisinger, Tawanna Cooler, MD  QUEtiapine (SEROQUEL XR) 50 MG TB24 24 hr tablet Take 1 tablet (50 mg total) by mouth at bedtime. 02/06/17   Meisinger, Tawanna Cooler, MD    Family History Family History  Problem Relation Age of Onset  . Arthritis Mother   . Diabetes Father   . Heart disease Paternal Grandmother   . Hypertension Paternal Grandmother   . Diabetes Paternal Grandmother   . Diabetes Paternal Grandfather     Social History Social History  Substance Use Topics  . Smoking status: Never Smoker  . Smokeless tobacco: Never Used  .  Alcohol use No     Allergies   Latex   Review of Systems Review of Systems  Constitutional: Negative for appetite change, fatigue and fever.  HENT: Negative.   Eyes: Negative for photophobia and visual disturbance.  Respiratory: Negative for cough and shortness of breath.   Cardiovascular: Negative for chest pain.  Gastrointestinal: Negative.   Genitourinary: Negative.   Musculoskeletal: Negative.   Skin: Negative.   Neurological: Positive for facial asymmetry. Negative for syncope and headaches.  Psychiatric/Behavioral: Negative for confusion.     Physical Exam Updated Vital Signs BP 107/81   Pulse 73   Temp 98.3 F (36.8 C)   Resp 17   SpO2 100%   Physical Exam  Constitutional: She is oriented to person, place, and time. She appears  well-developed and well-nourished. No distress.  HENT:  Head: Normocephalic and atraumatic.  Mouth/Throat: Oropharynx is clear and moist.  Eyes: Pupils are equal, round, and reactive to light. Conjunctivae and EOM are normal.  Neck: Normal range of motion. Neck supple.  Cardiovascular: Normal rate, regular rhythm, normal heart sounds and intact distal pulses.  Exam reveals no gallop and no friction rub.   No murmur heard. Pulmonary/Chest: Effort normal and breath sounds normal. No respiratory distress. She has no wheezes. She has no rales.  Abdominal: Soft. Bowel sounds are normal.  Musculoskeletal: Normal range of motion.  Neurological: She is alert and oriented to person, place, and time.  Right-sided facial droop, including asymmetric eye brow raise.  Mental Status:  Alert, oriented, thought content appropriate, able to give a coherent history. Speech fluent without evidence of aphasia. Able to follow 2 step commands without difficulty.  Cranial Nerves:  II:  Peripheral visual fields grossly normal, pupils equal, round, reactive to light III,IV, VI: ptosis of right lid present, extra-ocular motions intact bilaterally  V,VII: smile asymmetric with right-sided droop, facial light touch sensation equal VIII: hearing grossly normal to voice  X: uvula elevates symmetrically  XI: bilateral shoulder shrug symmetric and strong XII: tongue extending to the left, without fassiculations Motor:  Normal tone. 5/5 in upper and lower extremities bilaterally including strong and equal grip strength and dorsiflexion/plantar flexion Sensory: Pinprick and light touch normal in all extremities.  Deep Tendon Reflexes: 2+ and symmetric in the biceps and patella Cerebellar: normal finger-to-nose with bilateral upper extremities Gait: normal gait and balance CV: distal pulses palpable throughout    Skin: Skin is warm.  Psychiatric: She has a normal mood and affect. Her behavior is normal.  Nursing note  and vitals reviewed.    ED Treatments / Results  Labs (all labs ordered are listed, but only abnormal results are displayed) Labs Reviewed  CBC WITH DIFFERENTIAL/PLATELET - Abnormal; Notable for the following:       Result Value   RDW 16.5 (*)    All other components within normal limits  COMPREHENSIVE METABOLIC PANEL - Abnormal; Notable for the following:    BUN <5 (*)    Calcium 8.5 (*)    Total Protein 6.1 (*)    Albumin 2.7 (*)    All other components within normal limits    EKG  EKG Interpretation None       Radiology Ct Head Wo Contrast  Result Date: 02/08/2017 CLINICAL DATA:  Patient with left facial numbness and drooping. Leg swelling. EXAM: CT HEAD WITHOUT CONTRAST TECHNIQUE: Contiguous axial images were obtained from the base of the skull through the vertex without intravenous contrast. COMPARISON:  None.1 FINDINGS: Brain: Ventricles and  sulci are appropriate for patient's age. No evidence for acute cortically based infarct, intracranial hemorrhage, mass lesion or mass-effect. Vascular: Unremarkable Skull: Intact. Sinuses/Orbits: Paranasal sinuses are well aerated. Mastoid air cells are unremarkable. Orbits are unremarkable. Other: None. IMPRESSION: No acute intracranial process. Electronically Signed   By: Drew  Davis M.D.   On: 02/08/2017 13:27    ProcedurAnnia Beltes Procedures (including critical care time)  Medications Ordered in ED Medications  predniSONE (DELTASONE) tablet 60 mg (60 mg Oral Given 02/08/17 1540)     Initial Impression / Assessment and Plan / ED Course  I have reviewed the triage vital signs and the nursing notes.  Pertinent labs & imaging results that were available during my care of the patient were reviewed by me and considered in my medical decision making (see chart for details).     Pt with right-sided Bell's palsy s/p delivery by c-section on 02/03/17. Right-sided facial droop including asymmetry of right eye brow raise. CT head is negative  for acute stroke or intracranial pathology. Remainder of neurologic exam is normal, no deficits in extremities, A&Ox3, normal coordination, normal gait, normal mentation. No evidence of DVT or PE. VSS, not in distress, labs unremarkable. Discussed findings and diagnosis with patient. Discussed risks vs benefits of antiviral with lactation, pt decided against antiviral tx. Will discharge with prednisone. Pt encouraged to use eye drops and eye cover at night for protection. Pt to follow up with PCP. Neuro referral given as needed. Pt is safe for discharge.  Patient discussed with Dr. Rosalia Hammersay.  Discussed results, findings, treatment and follow up. Patient advised of return precautions. Patient verbalized understanding and agreed with plan.  Final Clinical Impressions(s) / ED Diagnoses   Final diagnoses:  Right-sided Bell's palsy    New Prescriptions New Prescriptions   PREDNISONE (DELTASONE) 20 MG TABLET    Take 3 tablets (60 mg total) by mouth daily with breakfast.     Russo, [place] N, PA-C 02/08/17 1546    Margarita Grizzleay, Danielle, MD 02/09/17 [phone]

## 2017-02-08 NOTE — ED Triage Notes (Signed)
[facility]. Pt states he has L side facial numbness and drooping since yesterday. Denies extremity weakness. Pt also reports swelling to her feet. Pt with equal grips and moves all extremities well.

## 2017-03-26 ENCOUNTER — Inpatient Hospital Stay (HOSPITAL_COMMUNITY)
Admission: AD | Admit: 2017-03-26 | Discharge: 2017-03-26 | Disposition: A | Discharge status: Home / Self Care | Payer: Medicaid Other | Source: Ambulatory Visit | Attending: Obstetrics and Gynecology | Admitting: Obstetrics and Gynecology

## 2017-03-26 ENCOUNTER — Encounter (HOSPITAL_COMMUNITY): Payer: Self-pay | Admitting: *Deleted

## 2017-03-26 DIAGNOSIS — N938 Other specified abnormal uterine and vaginal bleeding: Secondary | ICD-10-CM | POA: Insufficient documentation

## 2017-03-26 DIAGNOSIS — Z79899 Other long term (current) drug therapy: Secondary | ICD-10-CM | POA: Insufficient documentation

## 2017-03-26 DIAGNOSIS — F419 Anxiety disorder, unspecified: Secondary | ICD-10-CM | POA: Diagnosis not present

## 2017-03-26 DIAGNOSIS — N939 Abnormal uterine and vaginal bleeding, unspecified: Secondary | ICD-10-CM | POA: Diagnosis present

## 2017-03-26 LAB — CBC
HEMATOCRIT: 37.2 % (ref 36.0–46.0)
HEMOGLOBIN: 12.2 g/dL (ref 12.0–15.0)
MCH: 27.3 pg (ref 26.0–34.0)
MCHC: 32.8 g/dL (ref 30.0–36.0)
MCV: 83.2 fL (ref 78.0–100.0)
Platelets: 198 10*3/uL (ref 150–400)
RBC: 4.47 MIL/uL (ref 3.87–5.11)
RDW: 14.7 % (ref 11.5–15.5)
WBC: 6.6 10*3/uL (ref 4.0–10.5)

## 2017-03-26 NOTE — Discharge Instructions (Signed)
Dysfunctional Uterine Bleeding °Dysfunctional uterine bleeding is abnormal bleeding from the uterus. Dysfunctional uterine bleeding includes: °· A period that comes earlier or later than usual. °· A period that is lighter, heavier, or has blood clots. °· Bleeding between periods. °· Skipping one or more periods. °· Bleeding after sexual intercourse. °· Bleeding after menopause. ° °Follow these instructions at home: °Pay attention to any changes in your symptoms. Follow these instructions to help with your condition: °Eating and drinking °· Eat well-balanced meals. Include foods that are high in iron, such as liver, meat, shellfish, green leafy vegetables, and eggs. °· If you become constipated: °? Drink plenty of water. °? Eat fruits and vegetables that are high in water and fiber, such as spinach, carrots, raspberries, apples, and mango. °Medicines °· Take over-the-counter and prescription medicines only as told by your health care provider. °· Do not change medicines without talking with your health care provider. °· Aspirin or medicines that contain aspirin may make the bleeding worse. Do not take those medicines: °? During the week before your period. °? During your period. °· If you were prescribed iron pills, take them as told by your health care provider. Iron pills help to replace iron that your body loses because of this condition. °Activity °· If you need to change your sanitary pad or tampon more than one time every 2 hours: °? Lie in bed with your feet raised (elevated). °? Place a cold pack on your lower abdomen. °? Rest as much as possible until the bleeding stops or slows down. °· Do not try to lose weight until the bleeding has stopped and your blood iron level is back to normal. °Other Instructions °· For two months, write down: °? When your period starts. °? When your period ends. °? When any abnormal bleeding occurs. °? What problems you notice. °· Keep all follow up visits as told by your health  care provider. This is important. °Contact a health care provider if: °· You get light-headed or weak. °· You have nausea and vomiting. °· You cannot eat or drink without vomiting. °· You feel dizzy or have diarrhea while you are taking medicines. °· You are taking birth control pills or hormones, and you want to change them or stop taking them. °Get help right away if: °· You develop a fever or chills. °· You need to change your sanitary pad or tampon more than one time per hour. °· Your bleeding becomes heavier, or your flow contains clots more often. °· You develop pain in your abdomen. °· You lose consciousness. °· You develop a rash. °This information is not intended to replace advice given to you by your health care provider. Make sure you discuss any questions you have with your health care provider. °Document Released: 06/28/2000 Document Revised: 12/07/2015 Document Reviewed: 09/26/2014 °Elsevier Interactive Patient Education © 2018 [employer] ° °

## 2017-03-26 NOTE — MAU Note (Signed)
Pt presents with c/o heavy VB & clots that began yesterday.  Pt reports s/p C-Section on July 23.  Pt states she had  PP visit last week and bleeding had tapered off since delivery.
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
## 2017-08-04 ENCOUNTER — Ambulatory Visit: Payer: Medicaid Other | Admitting: Neurology

## 2019-11-08 ENCOUNTER — Emergency Department (HOSPITAL_COMMUNITY)
Admission: EM | Admit: 2019-11-08 | Discharge: 2019-11-09 | Discharge status: Against Medical Advice | Payer: Medicaid Other | Attending: Emergency Medicine | Admitting: Emergency Medicine

## 2019-11-08 ENCOUNTER — Encounter (HOSPITAL_COMMUNITY): Payer: Self-pay

## 2019-11-08 ENCOUNTER — Other Ambulatory Visit: Payer: Self-pay

## 2019-11-08 DIAGNOSIS — Z5321 Procedure and treatment not carried out due to patient leaving prior to being seen by health care provider: Secondary | ICD-10-CM | POA: Diagnosis not present

## 2019-11-08 DIAGNOSIS — N939 Abnormal uterine and vaginal bleeding, unspecified: Secondary | ICD-10-CM | POA: Diagnosis present

## 2019-11-08 LAB — PREGNANCY, URINE: Preg Test, Ur: NEGATIVE

## 2019-11-08 NOTE — ED Triage Notes (Addendum)
Pt presents with vaginal bleeding for several weeks. States she took  Plan B pill x2 pt tearful in triage. sattes she doesn't want to be pregnant. She is scheduled to see PCP wednesday for birth control. Pt also reports Right eye swelling, states "her bell palsy swelling"

## 2020-01-03 ENCOUNTER — Telehealth: Payer: Self-pay | Admitting: *Deleted

## 2020-01-03 ENCOUNTER — Encounter: Payer: Self-pay | Admitting: Neurology

## 2020-01-03 ENCOUNTER — Ambulatory Visit: Payer: Medicaid Other | Admitting: Neurology

## 2020-01-03 NOTE — Telephone Encounter (Signed)
No showed new patient appointment. 

## 2020-02-08 ENCOUNTER — Encounter: Payer: Self-pay | Admitting: Neurology

## 2020-02-29 ENCOUNTER — Ambulatory Visit: Payer: Medicaid Other | Admitting: Neurology

## 2020-04-08 ENCOUNTER — Emergency Department (HOSPITAL_COMMUNITY)
Admission: EM | Admit: 2020-04-08 | Discharge: 2020-04-08 | Disposition: A | Discharge status: Home / Self Care | Payer: Medicaid Other | Attending: Emergency Medicine | Admitting: Emergency Medicine

## 2020-04-08 DIAGNOSIS — R300 Dysuria: Secondary | ICD-10-CM | POA: Diagnosis present

## 2020-04-08 DIAGNOSIS — B373 Candidiasis of vulva and vagina: Secondary | ICD-10-CM | POA: Diagnosis not present

## 2020-04-08 DIAGNOSIS — A599 Trichomoniasis, unspecified: Secondary | ICD-10-CM | POA: Insufficient documentation

## 2020-04-08 DIAGNOSIS — Z91040 Latex allergy status: Secondary | ICD-10-CM | POA: Diagnosis not present

## 2020-04-08 DIAGNOSIS — N390 Urinary tract infection, site not specified: Secondary | ICD-10-CM | POA: Diagnosis not present

## 2020-04-08 DIAGNOSIS — N9489 Other specified conditions associated with female genital organs and menstrual cycle: Secondary | ICD-10-CM

## 2020-04-08 DIAGNOSIS — B3731 Acute candidiasis of vulva and vagina: Secondary | ICD-10-CM

## 2020-04-08 LAB — URINALYSIS, ROUTINE W REFLEX MICROSCOPIC
Bilirubin Urine: NEGATIVE
Glucose, UA: NEGATIVE mg/dL
Hgb urine dipstick: NEGATIVE
Ketones, ur: NEGATIVE mg/dL
Nitrite: NEGATIVE
Protein, ur: NEGATIVE mg/dL
Specific Gravity, Urine: 1.021 (ref 1.005–1.030)
pH: 5 (ref 5.0–8.0)

## 2020-04-08 LAB — WET PREP, GENITAL
Clue Cells Wet Prep HPF POC: NONE SEEN
Sperm: NONE SEEN

## 2020-04-08 LAB — POC URINE PREG, ED: Preg Test, Ur: NEGATIVE

## 2020-04-08 LAB — PREGNANCY, URINE: Preg Test, Ur: NEGATIVE

## 2020-04-08 MED ORDER — DOXYCYCLINE HYCLATE 100 MG PO CAPS: 100.0000 mg | ORAL_CAPSULE | Freq: Two times a day (BID) | ORAL | 0 refills | Status: DC

## 2020-04-08 MED ORDER — METRONIDAZOLE 500 MG PO TABS: 500.0000 mg | ORAL_TABLET | Freq: Two times a day (BID) | ORAL | 0 refills | Status: DC

## 2020-04-08 MED ORDER — CEPHALEXIN 500 MG PO CAPS: 500.0000 mg | ORAL_CAPSULE | Freq: Two times a day (BID) | ORAL | 0 refills | Status: DC

## 2020-04-08 MED ORDER — FLUCONAZOLE 200 MG PO TABS: 200.0000 mg | ORAL_TABLET | Freq: Every day | ORAL | 0 refills | Status: AC

## 2020-04-08 MED ORDER — STERILE WATER FOR INJECTION IJ SOLN
INTRAMUSCULAR | Status: AC
  Filled 2020-04-08: qty 10

## 2020-04-08 MED ORDER — BENZOCAINE-MENTHOL 20-0.5 % EX AERO: 1.0000 "application " | INHALATION_SPRAY | Freq: Four times a day (QID) | CUTANEOUS | 0 refills | Status: DC | PRN

## 2020-04-08 MED ORDER — CEFTRIAXONE SODIUM 500 MG IJ SOLR
500.0000 mg | INTRAMUSCULAR | Status: DC
  Administered 2020-04-08: 500 mg via INTRAMUSCULAR
  Filled 2020-04-08: qty 500

## 2020-04-08 NOTE — Discharge Instructions (Addendum)
Apply Dermoplast to area as prescribed. Drink plenty of water. Take Doxycycline and Flagyl as prescribed and complete the full course. Take Diflucan, 1 pill today, for yeast infection. Repeat this dose in 1 week if still itching.  Follow up with your doctor. No sex for the next 10 days while infection is treated. Recommend virtual health visit for your spouse for treatment to prevent reinfection. Follow up in your mychart for your gonorrhea and chlamydia results. Your treatment today covers these infections however if you test positive, your husband will also need to be treated.

## 2020-04-08 NOTE — ED Provider Notes (Signed)
[facility] EMERGENCY DEPARTMENT Provider Note   CSN: [id number] Arrival date & time: 04/08/20  1029     History Chief Complaint  Patient presents with  . Hematuria  . Dysuria    Shelley Merritt is a 29 y.o. female.  29yo female presents with complaint of burning pain in her vagina, worse with voiding, also reports "clear/yellow/chunky" discharge. Also reports a pink tinge on the tissue with wiping, has been to PCP about this without diagnosis made. No recent antibiotics.  Patient is sexually active her with husband. No other complaints or concerns.         Past Medical History:  Diagnosis Date  . Anemia   . Anxiety    Panic Attacks, on meds  . Eczema   . Headache   . Infection    UTI    Patient Active Problem List   Diagnosis Date Noted  . S/P repeat low transverse C-section 02/03/2017    Past Surgical History:  Procedure Laterality Date  . CESAREAN SECTION  09/28/2011   Procedure: CESAREAN SECTION;  Surgeon: Bing Plume, MD;  Location: [facility];  Service: Gynecology;  Laterality: N/A;  Primary cesarean section with delivery of baby girl at  59. Apgars 9/10.  Marland Kitchen CESAREAN SECTION N/A 02/03/2017   Procedure: CESAREAN SECTION;  Surgeon: Huel Cote, MD;  Location: [facility] BIRTHING SUITES;  Service: Obstetrics;  Laterality: N/A;  . MOUTH SURGERY       OB History    Gravida  3   Para  2   Term  2   Preterm      AB  1   Living  2     SAB      TAB  1   Ectopic      Multiple  0   Live Births  2           Family History  Problem Relation Age of Onset  . Arthritis Mother   . Diabetes Father   . Heart disease Paternal Grandmother   . Hypertension Paternal Grandmother   . Diabetes Paternal Grandmother   . Arthritis Paternal Grandmother   . Diabetes Paternal Grandfather     Social History   Tobacco Use  . Smoking status: Never Smoker  . Smokeless tobacco: Never Used  Substance Use Topics  . Alcohol use: No  .  Drug use: No    Home Medications Prior to Admission medications   Medication Sig Start Date End Date Taking? Authorizing Provider  acetaminophen (TYLENOL) 500 MG tablet Take 500 mg by mouth every 6 (six) hours as needed.    [provider]  benzocaine-Menthol (DERMOPLAST) 20-0.5 % AERO Apply 1 application topically 4 (four) times daily as needed for irritation. 04/08/20   Jeannie Fend, PA-C  doxycycline (VIBRAMYCIN) 100 MG capsule Take 1 capsule (100 mg total) by mouth 2 (two) times daily. 04/08/20   Jeannie Fend, PA-C  fluconazole (DIFLUCAN) 200 MG tablet Take 1 tablet (200 mg total) by mouth daily for 1 day. Repeat dose in 1 week if symptoms persist 04/08/20 04/09/20  Army Melia A, PA-C  ibuprofen (ADVIL,MOTRIN) 600 MG tablet Take 1 tablet (600 mg total) by mouth every 6 (six) hours. 02/06/17   Meisinger, Tawanna Cooler, MD  metroNIDAZOLE (FLAGYL) 500 MG tablet Take 1 tablet (500 mg total) by mouth 2 (two) times daily. 04/08/20   Jeannie Fend, PA-C  QUEtiapine (SEROQUEL XR) 50 MG TB24 24 hr tablet Take 1 tablet (50  mg total) by mouth at bedtime. 02/06/17   Meisinger, Tawanna Cooler, MD    Allergies    Eggs or egg-derived products, Lexapro [escitalopram], and Latex  Review of Systems   Review of Systems  Constitutional: Negative for fever.  Gastrointestinal: Negative for abdominal pain, nausea and vomiting.  Genitourinary: Positive for dysuria, vaginal discharge and vaginal pain. Negative for frequency, pelvic pain and vaginal bleeding.  Musculoskeletal: Negative for back pain.  Skin: Negative for rash and wound.  Allergic/Immunologic: Negative for immunocompromised state.  All other systems reviewed and are negative.   Physical Exam Updated Vital Signs BP 106/67   Pulse 71   Temp 98.4 F (36.9 C)   Resp 20   LMP 03/29/2020   SpO2 100%   Physical Exam Vitals and nursing note reviewed. Exam conducted with a chaperone present.  Constitutional:      General: She is not in acute  distress.    Appearance: She is well-developed. She is not diaphoretic.  HENT:     Head: Normocephalic and atraumatic.  Pulmonary:     Effort: Pulmonary effort is normal.  Abdominal:     Palpations: Abdomen is soft.     Tenderness: There is no abdominal tenderness.  Genitourinary:    Labia:        Right: Tenderness present. No rash, lesion or injury.        Left: Tenderness present. No rash, lesion or injury.      Comments: Mild swelling to labia minora and clitoral hood. No obvious discharge present however exam limited as patient will not allow for/tolerate speculum exam. Swabs obtained without speculum. No external lesions.  Skin:    General: Skin is warm and dry.     Findings: No erythema or rash.  Neurological:     Mental Status: She is alert and oriented to person, place, and time.  Psychiatric:        Behavior: Behavior normal.     ED Results / Procedures / Treatments   Labs (all labs ordered are listed, but only abnormal results are displayed) Labs Reviewed  WET PREP, GENITAL - Abnormal; Notable for the following components:      Result Value   Yeast Wet Prep HPF POC PRESENT (*)    Trich, Wet Prep PRESENT (*)    WBC, Wet Prep HPF POC MANY (*)    All other components within normal limits  URINALYSIS, ROUTINE W REFLEX MICROSCOPIC - Abnormal; Notable for the following components:   APPearance HAZY (*)    Leukocytes,Ua MODERATE (*)    Bacteria, UA RARE (*)    All other components within normal limits  PREGNANCY, URINE  POC URINE PREG, ED  GC/CHLAMYDIA PROBE AMP () NOT AT [facility]    EKG None  Radiology No results found.  Procedures Procedures (including critical care time)  Medications Ordered in ED Medications  cefTRIAXone (ROCEPHIN) injection 500 mg (500 mg Intramuscular Given 04/08/20 1419)  sterile water (preservative free) injection (has no administration in time range)    ED Course  I have reviewed the triage vital signs and the nursing  notes.  Pertinent labs & imaging results that were available during my care of the patient were reviewed by me and considered in my medical decision making (see chart for details).  Clinical Course as of Apr 09 1439  Sat Apr 08, 2020  6569 29 year old female with complaint of vaginal irritation and dysuria.  Limited pelvic exam due to discomfort however is found to have swelling  of the labia minora and clitoral hood.  Unable to tolerate speculum exam.  Wet prep collected and is positive for yeast and trichomoniasis with many white blood cells.  hCG is negative.  Gonorrhea chlamydia testing pending. Urinalysis positive for moderate leukocytes with rare bacteria and limited epithelials. Plan is to cover patient for possible UTI in the presence of trichomoniasis and yeast with doxycycline, Flagyl, Diflucan.  Will give an injection of Rocephin here for gonorrhea coverage as well. Gust results with patient, recommend that she discuss these results with her husband for him to seek treatment, she is advised to follow-up for her gonorrhea chlamydia testing results. Vies to abstain from intercourse and follow-up with her PCP for recheck.   [LM]    Clinical Course User Index [LM] Alden Hipp   MDM Rules/Calculators/A&P                          Final Clinical Impression(s) / ED Diagnoses Final diagnoses:  Urinary tract infection without hematuria, site unspecified  Labial swelling  Vaginal yeast infection  Trichimoniasis    Rx / DC Orders ED Discharge Orders         Ordered    benzocaine-Menthol (DERMOPLAST) 20-0.5 % AERO  4 times daily PRN        04/08/20 1328    cephALEXin (KEFLEX) 500 MG capsule  2 times daily,   Status:  Discontinued        04/08/20 1328    doxycycline (VIBRAMYCIN) 100 MG capsule  2 times daily        04/08/20 1356    metroNIDAZOLE (FLAGYL) 500 MG tablet  2 times daily        04/08/20 1356    fluconazole (DIFLUCAN) 200 MG tablet  Daily        04/08/20 1356             Alden Hipp 04/08/20 1440    Milagros Loll, MD 04/09/20 0730

## 2020-04-08 NOTE — ED Notes (Signed)
Patient Alert and oriented to baseline. Stable and ambulatory to baseline. Patient verbalized understanding of the discharge instructions.  Patient belongings were taken by the patient.   

## 2020-04-08 NOTE — ED Triage Notes (Signed)
Pt. Stated, Shelley Merritt had vaginal itching and painful urination

## 2020-04-10 LAB — GC/CHLAMYDIA PROBE AMP (~~LOC~~) NOT AT ARMC
Chlamydia: NEGATIVE
Chlamydia: NEGATIVE
Comment: NEGATIVE
Comment: NORMAL
Comment: NEGATIVE
Comment: NORMAL
Neisseria Gonorrhea: NEGATIVE
Neisseria Gonorrhea: NEGATIVE

## 2020-04-11 ENCOUNTER — Telehealth: Payer: Self-pay | Admitting: *Deleted

## 2020-04-11 ENCOUNTER — Ambulatory Visit: Payer: Medicaid Other | Admitting: Neurology

## 2020-04-11 NOTE — Telephone Encounter (Signed)
No showed new patient appointment. 

## 2020-04-17 ENCOUNTER — Encounter: Payer: Self-pay | Admitting: Neurology

## 2020-05-31 ENCOUNTER — Other Ambulatory Visit: Payer: Self-pay

## 2020-05-31 ENCOUNTER — Encounter: Payer: Self-pay | Admitting: Family Medicine

## 2020-05-31 ENCOUNTER — Ambulatory Visit (INDEPENDENT_AMBULATORY_CARE_PROVIDER_SITE_OTHER): Payer: Medicaid Other | Admitting: Family Medicine

## 2020-05-31 VITALS — BP 115/67 | HR 91 | Temp 98.3°F | Ht 62.0 in | Wt 117.0 lb

## 2020-05-31 DIAGNOSIS — F411 Generalized anxiety disorder: Secondary | ICD-10-CM

## 2020-05-31 DIAGNOSIS — F5089 Other specified eating disorder: Secondary | ICD-10-CM

## 2020-05-31 DIAGNOSIS — Z8669 Personal history of other diseases of the nervous system and sense organs: Secondary | ICD-10-CM

## 2020-05-31 DIAGNOSIS — D509 Iron deficiency anemia, unspecified: Secondary | ICD-10-CM

## 2020-05-31 DIAGNOSIS — Z0000 Encounter for general adult medical examination without abnormal findings: Secondary | ICD-10-CM

## 2020-05-31 DIAGNOSIS — R350 Frequency of micturition: Secondary | ICD-10-CM | POA: Diagnosis not present

## 2020-05-31 DIAGNOSIS — N898 Other specified noninflammatory disorders of vagina: Secondary | ICD-10-CM

## 2020-05-31 LAB — POCT WET + KOH PREP
Trich by wet prep: ABSENT
Yeast by KOH: ABSENT
Yeast by wet prep: ABSENT

## 2020-05-31 LAB — POCT URINALYSIS DIP (MANUAL ENTRY)
Bilirubin, UA: NEGATIVE
Blood, UA: NEGATIVE
Glucose, UA: NEGATIVE mg/dL
Ketones, POC UA: NEGATIVE mg/dL
Leukocytes, UA: NEGATIVE
Nitrite, UA: NEGATIVE
Protein Ur, POC: NEGATIVE mg/dL
Spec Grav, UA: >=1.03 — AB (ref 1.010–1.025)
Urobilinogen, UA: 0.2 E.U./dL
pH, UA: 5.5 (ref 5.0–8.0)

## 2020-05-31 MED ORDER — FERROUS SULFATE 325 (65 FE) MG PO TABS: 325.0000 mg | ORAL_TABLET | Freq: Every day | ORAL | 3 refills | Status: DC

## 2020-05-31 MED ORDER — QUETIAPINE FUMARATE ER 50 MG PO TB24: 50.0000 mg | ORAL_TABLET | Freq: Every day | ORAL | 3 refills | Status: DC

## 2020-05-31 NOTE — Progress Notes (Addendum)
11/17/202110:12 AM  Shelley Merritt Aug 18, 1990, 29 y.o., female [id number]  Chief Complaint  Patient presents with  . New Patient (Initial Visit)  . Anxiety    HPI:   Patient is a 29 y.o. female with past medical history significant for Anemia, Anxiety who presents today for New patient visit and anxiety.   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 Partner and daughter present with her at this appointment  Recently in the ED treated for Urinary discomfort Last month had a hard time walking due to vaginal discomfort Positive for trichomonas, BV, bacteria  Anxiety Diagnosed at young age 66 years ago got much worse Hospitalized off and on for 6 months Has not seen a therapist recently Frequent melt downs Triggered by what she cant control Easily overwhelmed Seroquel since 2014 Husband assists with her taking her pills Getting worse now, Likes to chew on toilet paper Does not swallow this 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 No records avail.  Neurology: Bells palsy diagnosed 3 years ago Drooping face lingers  No flowsheet data found.  No flowsheet data found.   Allergies  Allergen Reactions  . Eggs Or Egg-Derived Products Swelling  . Lexapro [Escitalopram] Swelling  . Latex Rash    Prior to Admission medications   Medication Sig Start Date End Date Taking? Authorizing Provider  QUEtiapine (SEROQUEL XR) 50 MG TB24 24 hr tablet Take 1 tablet (50 mg total) by mouth at bedtime. 02/06/17  Yes Meisinger, Tawanna Cooler, MD  acetaminophen (TYLENOL) 500 MG tablet Take 500 mg by mouth every 6 (six) hours as needed. Patient not taking: Reported on 05/31/2020    [provider]  benzocaine-Menthol (DERMOPLAST) 20-0.5 % AERO Apply 1 application topically 4 (four) times daily as needed for irritation. Patient not taking: Reported on 05/31/2020 04/08/20   Army Melia A, PA-C    doxycycline (VIBRAMYCIN) 100 MG capsule Take 1 capsule (100 mg total) by mouth 2 (two) times daily. Patient not taking: Reported on 05/31/2020 04/08/20   Army Melia A, PA-C  ferrous sulfate 325 (65 FE) MG tablet Take by mouth.    [provider]    Past Medical History:  Diagnosis Date  . Anemia   . Anxiety    Panic Attacks, on meds  . Eczema   . Headache   . Infection    UTI    Past Surgical History:  Procedure Laterality Date  . CESAREAN SECTION  09/28/2011   Procedure: CESAREAN SECTION;  Surgeon: Bing Plume, MD;  Location: [facility];  Service: Gynecology;  Laterality: N/A;  Primary cesarean section with delivery of baby girl at  45. Apgars 9/10.  Marland Kitchen CESAREAN SECTION N/A 02/03/2017   Procedure: CESAREAN SECTION;  Surgeon: Huel Cote, MD;  Location: [facility] BIRTHING SUITES;  Service: Obstetrics;  Laterality: N/A;  . MOUTH SURGERY      Social History   Tobacco Use  . Smoking status: Never Smoker  . Smokeless tobacco: Never Used  Substance Use Topics  . Alcohol use: No    Family History  Problem Relation Age of Onset  . Arthritis Mother   . Diabetes Father   . Heart disease Paternal Grandmother   . Hypertension Paternal Grandmother   . Diabetes Paternal Grandmother   . Arthritis Paternal Grandmother   . Diabetes Paternal Grandfather     Review of Systems  Constitutional: Negative for chills, fever  and malaise/fatigue.  Eyes: Negative for blurred vision and double vision.  Respiratory: Negative for cough, shortness of breath and wheezing.   Cardiovascular: Negative for chest pain, palpitations and leg swelling.  Gastrointestinal: Negative for abdominal pain, blood in stool, constipation, diarrhea, heartburn, nausea and vomiting.  Genitourinary: Negative for dysuria, frequency and hematuria.  Musculoskeletal: Negative for back pain and joint pain.  Skin: Negative for rash.  Neurological: Negative for dizziness, weakness and headaches.   Psychiatric/Behavioral: Negative for depression and suicidal ideas. The patient is nervous/anxious.      OBJECTIVE:  Today's Vitals   05/31/20 0900  BP: 115/67  Pulse: 91  Temp: 98.3 F (36.8 C)  SpO2: 100%  Weight: 117 lb (53.1 kg)  Height: 5\' 2"  (1.575 m)   Body mass index is 21.4 kg/m.   Physical Exam Constitutional:      General: She is not in acute distress.    Appearance: Normal appearance. She is not ill-appearing.  HENT:     Head: Normocephalic.     Mouth/Throat:     Comments: Left sided droop at baseline Cardiovascular:     Rate and Rhythm: Normal rate and regular rhythm.     Pulses: Normal pulses.     Heart sounds: Normal heart sounds. No murmur heard.  No friction rub. No gallop.   Pulmonary:     Effort: Pulmonary effort is normal. No respiratory distress.     Breath sounds: Normal breath sounds. No stridor. No wheezing, rhonchi or rales.  Abdominal:     General: Bowel sounds are normal.     Palpations: Abdomen is soft.     Tenderness: There is no abdominal tenderness.  Musculoskeletal:     Right lower leg: No edema.     Left lower leg: No edema.  Skin:    General: Skin is warm and dry.  Neurological:     Mental Status: She is alert and oriented to person, place, and time.  Psychiatric:        Mood and Affect: Mood normal.        Behavior: Behavior normal.     Results for orders placed or performed in visit on 05/31/20 (from the past 24 hour(s))  POCT urinalysis dipstick     Status: Abnormal   Collection Time: 05/31/20  9:30 AM  Result Value Ref Range   Color, UA yellow yellow   Clarity, UA clear clear   Glucose, UA negative negative mg/dL   Bilirubin, UA negative negative   Ketones, POC UA negative negative mg/dL   Spec Grav, UA 06/02/20 (A) 1.010 - 1.025   Blood, UA negative negative   pH, UA 5.5 5.0 - 8.0   Protein Ur, POC negative negative mg/dL   Urobilinogen, UA 0.2 0.2 or 1.0 E.U./dL   Nitrite, UA Negative Negative   Leukocytes,  UA Negative Negative  POCT Wet + KOH Prep     Status: Abnormal   Collection Time: 05/31/20  9:31 AM  Result Value Ref Range   Yeast by KOH Absent Absent   Yeast by wet prep Absent Absent   WBC by wet prep None (A) Few   Clue Cells Wet Prep HPF POC Few (A) None   Trich by wet prep Absent Absent   Bacteria Wet Prep HPF POC Few Few   Epithelial Cells By 06/02/20 Pref ([facility]) Few None, Few, Too numerous to count   RBC,UR,HPF,POC None None RBC/hpf    No results found.   ASSESSMENT and PLAN  Problem List  Items Addressed This Visit    None    Visit Diagnoses    Urinary frequency    -  Primary   Relevant Orders   POCT urinalysis dipstick (Completed): negative for abnormalities   Vaginal itching       Relevant Orders   POCT Wet + KOH Prep (Completed): negative for abnormalities   Generalized anxiety disorder       Relevant Medications   QUEtiapine (SEROQUEL XR) 50 MG TB24 24 hr tablet Refills sent Discussed finding a counselor and contacts were provided   Other Relevant Orders   Ambulatory referral to Psychiatry   Visit for annual health examination       Relevant Orders   CBC   Iron, TIBC and Ferritin Panel   Comprehensive metabolic panel   Vitamin D, 25-hydroxy   TSH   Lipid Panel Will follow up with lab results   Pica in adults     Order placed to psychiatry   Iron deficiency anemia, unspecified iron deficiency anemia type       Relevant Medications   ferrous sulfate 325 (65 FE) MG tablet Will follow up with lab results   History of Bell's palsy     Originally referred to Neurology but did not go to this appointment Will continue to monitor     Declined vaccines at this time. Encouraged to schedule physical with pap.  Return in about 3 months (around 08/31/2020).    Macario Carls Shelley Calvin, FNP-BC Primary Care at [facility] [address] [place], [place] [address] Ph.  [phone] Fax [phone]

## 2020-05-31 NOTE — Patient Instructions (Addendum)
Referral placed for psychiatry, they will contact you.   For therapy -- 1. [facility] ([address] Hulan Amato [place],Heather Joycelyn Schmid [place]) [phone] 2. [facility] Raynelle Fanning [place]) [phone] 3. [facility] - [phone] 4. [facility] - [phone] (do not file insurance) 5. [facility] - accepts most major insurances. [phone] or email [email or web address]   Generalized Anxiety Disorder, Adult Generalized anxiety disorder (GAD) is a mental health disorder. People with this condition constantly worry about everyday events. Unlike normal anxiety, worry related to GAD is not triggered by a specific event. These worries also do not fade or get better with time. GAD interferes with life functions, including relationships, work, and school. GAD can vary from mild to severe. People with severe GAD can have intense waves of anxiety with physical symptoms (panic attacks). What are the causes? The exact cause of GAD is not known. What increases the risk? This condition is more likely to develop in:  Women.  People who have a family history of anxiety disorders.  People who are very shy.  People who experience very stressful life events, such as the death of a loved one.  People who have a very stressful family environment. What are the signs or symptoms? People with GAD often worry excessively about many things in their lives, such as their health and family. They may also be overly concerned about:  Doing well at work.  Being on time.  Natural disasters.  Friendships. Physical symptoms of GAD include:  Fatigue.  Muscle tension or having muscle twitches.  Trembling or feeling shaky.  Being easily startled.  Feeling like your heart is pounding or racing.  Feeling out of breath or like you cannot take a deep breath.  Having trouble falling  asleep or staying asleep.  Sweating.  Nausea, diarrhea, or irritable bowel syndrome (IBS).  Headaches.  Trouble concentrating or remembering facts.  Restlessness.  Irritability. How is this diagnosed? Your health care provider can diagnose GAD based on your symptoms and medical history. You will also have a physical exam. The health care provider will ask specific questions about your symptoms, including how severe they are, when they started, and if they come and go. Your health care provider may ask you about your use of alcohol or drugs, including prescription medicines. Your health care provider may refer you to a mental health specialist for further evaluation. Your health care provider will do a thorough examination and may perform additional tests to rule out other possible causes of your symptoms. To be diagnosed with GAD, a person must have anxiety that:  Is out of his or her control.  Affects several different aspects of his or her life, such as work and relationships.  Causes distress that makes him or her unable to take part in normal activities.  Includes at least three physical symptoms of GAD, such as restlessness, fatigue, trouble concentrating, irritability, muscle tension, or sleep problems. Before your health care provider can confirm a diagnosis of GAD, these symptoms must be present more days than they are not, and they must last for six months or longer. How is this treated? The following therapies are usually used to treat GAD:  Medicine. Antidepressant medicine is usually prescribed for long-term daily control. Antianxiety medicines may be added in severe cases, especially when panic attacks occur.  Talk therapy (psychotherapy). Certain types of talk therapy can be helpful in treating GAD by providing support, education, and  guidance. Options include: ? Cognitive behavioral therapy (CBT). People learn coping skills and techniques to ease their anxiety. They  learn to identify unrealistic or negative thoughts and behaviors and to replace them with positive ones. ? Acceptance and commitment therapy (ACT). This treatment teaches people how to be mindful as a way to cope with unwanted thoughts and feelings. ? Biofeedback. This process trains you to manage your body's response (physiological response) through breathing techniques and relaxation methods. You will work with a therapist while machines are used to monitor your physical symptoms.  Stress management techniques. These include yoga, meditation, and exercise. A mental health specialist can help determine which treatment is best for you. Some people see improvement with one type of therapy. However, other people require a combination of therapies. Follow these instructions at home:  Take over-the-counter and prescription medicines only as told by your health care provider.  Try to maintain a normal routine.  Try to anticipate stressful situations and allow extra time to manage them.  Practice any stress management or self-calming techniques as taught by your health care provider.  Do not punish yourself for setbacks or for not making progress.  Try to recognize your accomplishments, even if they are small.  Keep all follow-up visits as told by your health care provider. This is important. Contact a health care provider if:  Your symptoms do not get better.  Your symptoms get worse.  You have signs of depression, such as: ? A persistently sad, cranky, or irritable mood. ? Loss of enjoyment in activities that used to bring you joy. ? Change in weight or eating. ? Changes in sleeping habits. ? Avoiding friends or family members. ? Loss of energy for normal tasks. ? Feelings of guilt or worthlessness. Get help right away if:  You have serious thoughts about hurting yourself or others. If you ever feel like you may hurt yourself or others, or have thoughts about taking your own life,  get help right away. You can go to your nearest emergency department or call:  Your local emergency services (911 in the [place]  A suicide crisis helpline, such as the National Suicide Prevention Lifeline at 780-227-0574. This is open 24 hours a day. Summary  Generalized anxiety disorder (GAD) is a mental health disorder that involves worry that is not triggered by a specific event.  People with GAD often worry excessively about many things in their lives, such as their health and family.  GAD may cause physical symptoms such as restlessness, trouble concentrating, sleep problems, frequent sweating, nausea, diarrhea, headaches, and trembling or muscle twitching.  A mental health specialist can help determine which treatment is best for you. Some people see improvement with one type of therapy. However, other people require a combination of therapies. This information is not intended to replace advice given to you by your health care provider. Make sure you discuss any questions you have with your health care provider. Document Revised: 06/13/2017 Document Reviewed: 05/21/2016 Elsevier Patient Education  [employer].    If you have lab work done today you will be contacted with your lab results within the next 2 weeks.  If you have not heard from [place] then please contact us. The fastest way to get your results is to register for My Chart.   IF you received an x-ray today, you will receive an invoice from [facility]. Please contact [facility] at [phone] with questions or concerns regarding your invoice.   IF you received  labwork today, you will receive an invoice from [employer]. Please contact [facility] at [phone] with questions or concerns regarding your invoice.   Our billing staff will not be able to assist you with questions regarding bills from these companies.  You will be contacted with the lab results as soon as they are available. The fastest way  to get your results is to activate your My Chart account. Instructions are located on the last page of this paperwork. If you have not heard from [place] regarding the results in 2 weeks, please contact this office.

## 2020-06-01 ENCOUNTER — Encounter: Payer: Self-pay | Admitting: *Deleted

## 2020-06-01 ENCOUNTER — Telehealth: Payer: Self-pay | Admitting: Family Medicine

## 2020-06-01 ENCOUNTER — Other Ambulatory Visit: Payer: Self-pay | Admitting: Family Medicine

## 2020-06-01 DIAGNOSIS — D649 Anemia, unspecified: Secondary | ICD-10-CM

## 2020-06-01 DIAGNOSIS — E559 Vitamin D deficiency, unspecified: Secondary | ICD-10-CM

## 2020-06-01 LAB — CBC
Hematocrit: 26.3 % — ABNORMAL LOW (ref 34.0–46.6)
Hemoglobin: 7 g/dL — CL (ref 11.1–15.9)
MCH: 18.7 pg — ABNORMAL LOW (ref 26.6–33.0)
MCHC: 26.6 g/dL — ABNORMAL LOW (ref 31.5–35.7)
MCV: 70 fL — ABNORMAL LOW (ref 79–97)
Platelets: 273 10*3/uL (ref 150–450)
RBC: 3.74 x10E6/uL — ABNORMAL LOW (ref 3.77–5.28)
RDW: 17.2 % — ABNORMAL HIGH (ref 11.7–15.4)
WBC: 4.3 10*3/uL (ref 3.4–10.8)

## 2020-06-01 LAB — COMPREHENSIVE METABOLIC PANEL
ALT: 6 IU/L (ref 0–32)
AST: 10 IU/L (ref 0–40)
Albumin/Globulin Ratio: 1.5 (ref 1.2–2.2)
Albumin: 4.6 g/dL (ref 3.8–4.8)
Alkaline Phosphatase: 41 IU/L — ABNORMAL LOW (ref 44–121)
BUN/Creatinine Ratio: 25 — ABNORMAL HIGH (ref 9–23)
BUN: 13 mg/dL (ref 6–20)
Bilirubin Total: 0.3 mg/dL (ref 0.0–1.2)
CO2: 21 mmol/L (ref 20–29)
Calcium: 9.2 mg/dL (ref 8.7–10.2)
Chloride: 100 mmol/L (ref 96–106)
Creatinine, Ser: 0.53 mg/dL — ABNORMAL LOW (ref 0.57–1.00)
GFR calc Af Amer: 146 mL/min/{1.73_m2} (ref >59)
GFR calc non Af Amer: 127 mL/min/{1.73_m2} (ref >59)
Globulin, Total: 3 g/dL (ref 1.5–4.5)
Glucose: 88 mg/dL (ref 65–99)
Potassium: 3.6 mmol/L (ref 3.5–5.2)
Sodium: 139 mmol/L (ref 134–144)
Total Protein: 7.6 g/dL (ref 6.0–8.5)

## 2020-06-01 LAB — LIPID PANEL
Chol/HDL Ratio: 3.4 ratio (ref 0.0–4.4)
Cholesterol, Total: 141 mg/dL (ref 100–199)
HDL: 41 mg/dL (ref >39)
LDL Chol Calc (NIH): 90 mg/dL (ref 0–99)
Triglycerides: 44 mg/dL (ref 0–149)
VLDL Cholesterol Cal: 10 mg/dL (ref 5–40)

## 2020-06-01 LAB — IRON,TIBC AND FERRITIN PANEL
Ferritin: 5 ng/mL — ABNORMAL LOW (ref 15–150)
Iron Saturation: 3 % — CL (ref 15–55)
Iron: 14 ug/dL — ABNORMAL LOW (ref 27–159)
Total Iron Binding Capacity: 519 ug/dL — ABNORMAL HIGH (ref 250–450)
UIBC: 505 ug/dL — ABNORMAL HIGH (ref 131–425)

## 2020-06-01 LAB — VITAMIN D 25 HYDROXY (VIT D DEFICIENCY, FRACTURES): Vit D, 25-Hydroxy: 12.9 ng/mL — ABNORMAL LOW (ref 30.0–100.0)

## 2020-06-01 LAB — TSH: TSH: 1.9 u[IU]/mL (ref 0.450–4.500)

## 2020-06-01 MED ORDER — VITAMIN D (ERGOCALCIFEROL) 1.25 MG (50000 UNIT) PO CAPS: 50000.0000 [IU] | ORAL_CAPSULE | ORAL | 9 refills | Status: AC

## 2020-06-01 NOTE — Telephone Encounter (Signed)
[facility] called with lab result of Hemoglobin is 7.0

## 2020-06-01 NOTE — Telephone Encounter (Signed)
Returned pt call, no action needed at this time

## 2020-06-01 NOTE — Telephone Encounter (Signed)
06/01/2020 - PATIENT SAW KELSEA JUST AS A NEW PATIENT Wednesday (05/31/2020). SHE JUST CALLED AND SAID SOMEONE CALLED HER CELL PHONE BUT DID NOT LEAVE HER A MESSAGE? BEST PHONE [phone] (CELL) MBC

## 2020-06-01 NOTE — Progress Notes (Signed)
Hello If you could schedule her for a Nurse visit for a lab CBC recheck tomorrow (11/19). Thanks!

## 2020-06-01 NOTE — Telephone Encounter (Signed)
-----   Message from Azalee Course Just, FNP sent at 06/01/2020  2:10 PM EST ----- Hello If you could schedule her for a Nurse visit for a lab CBC recheck tomorrow (11/19). Thanks!

## 2020-06-01 NOTE — Telephone Encounter (Signed)
Tried to contact pt to schedule nurse visit. Pts vm is full and was unable to leave a message will try again later.

## 2020-06-02 ENCOUNTER — Other Ambulatory Visit: Payer: Self-pay | Admitting: Family Medicine

## 2020-06-02 ENCOUNTER — Encounter: Payer: Self-pay | Admitting: Family Medicine

## 2020-06-02 DIAGNOSIS — D649 Anemia, unspecified: Secondary | ICD-10-CM

## 2020-06-05 ENCOUNTER — Other Ambulatory Visit: Payer: Self-pay | Admitting: Family Medicine

## 2020-06-05 DIAGNOSIS — D509 Iron deficiency anemia, unspecified: Secondary | ICD-10-CM

## 2020-06-05 MED ORDER — FERROUS SULFATE 325 (65 FE) MG PO TABS: 325.0000 mg | ORAL_TABLET | Freq: Three times a day (TID) | ORAL | 3 refills | Status: AC

## 2020-06-05 NOTE — Progress Notes (Addendum)
10:10AM  Called patient discussed the need for her to come to the clinic for a repeat CBC. She will call to schedule this.  Educated to take her ferrous sulfate three times per day.  Notified that a referral was placed to Hematology and that they are trying to get ahold of her to schedule an appointment.   RTC and ED precautions provided.

## 2020-06-23 ENCOUNTER — Other Ambulatory Visit: Payer: Self-pay

## 2020-06-23 ENCOUNTER — Ambulatory Visit: Payer: Medicaid Other

## 2020-06-23 DIAGNOSIS — D649 Anemia, unspecified: Secondary | ICD-10-CM

## 2020-06-23 LAB — CBC WITH DIFFERENTIAL/PLATELET
Basophils Absolute: 0.1 10*3/uL (ref 0.0–0.2)
Basos: 1 %
EOS (ABSOLUTE): 0 10*3/uL (ref 0.0–0.4)
Eos: 1 %
Hematocrit: 28.5 % — ABNORMAL LOW (ref 34.0–46.6)
Hemoglobin: 8 g/dL — ABNORMAL LOW (ref 11.1–15.9)
Immature Grans (Abs): 0 10*3/uL (ref 0.0–0.1)
Immature Granulocytes: 0 %
Lymphocytes Absolute: 2.1 10*3/uL (ref 0.7–3.1)
Lymphs: 34 %
MCH: 20.4 pg — ABNORMAL LOW (ref 26.6–33.0)
MCHC: 28.1 g/dL — ABNORMAL LOW (ref 31.5–35.7)
MCV: 73 fL — ABNORMAL LOW (ref 79–97)
Monocytes Absolute: 0.5 10*3/uL (ref 0.1–0.9)
Monocytes: 8 %
Neutrophils Absolute: 3.5 10*3/uL (ref 1.4–7.0)
Neutrophils: 56 %
Platelets: 381 10*3/uL (ref 150–450)
RBC: 3.93 x10E6/uL (ref 3.77–5.28)
RDW: 19.3 % — ABNORMAL HIGH (ref 11.7–15.4)
WBC: 6.2 10*3/uL (ref 3.4–10.8)

## 2020-06-26 ENCOUNTER — Telehealth: Payer: Self-pay | Admitting: Family Medicine

## 2020-06-26 NOTE — Telephone Encounter (Addendum)
Dois Davenport with [facility] called to verify the dr got the results of the CBC lab from 06/23/20.

## 2020-06-27 NOTE — Telephone Encounter (Signed)
Patient returned the missed call.  Patient on the way to work. Prefers call back in the early morning. If she doesn't answer, patient wants test results left on her voicemail. Please advise at [phone].

## 2020-06-27 NOTE — Telephone Encounter (Signed)
Provider seen the result and left a msg for the patient

## 2020-06-27 NOTE — Telephone Encounter (Signed)
Please give patient below msg when patient return call. Your Hemoglobin and anemia is much improved. Continue to take the Iron 3 times per day and let me know if you have any questions or concerns.

## 2020-06-27 NOTE — Telephone Encounter (Signed)
Tried to reach patient once again mailbox is full unable to leave a msg. Please give patient below msg>>>> Your Hemoglobin and anemia is much improved. Continue to take the Iron 3 times per day and let me know if you have any questions or concerns.

## 2020-06-27 NOTE — Telephone Encounter (Signed)
Patient called and I  read below note / pt verbalized she understood .

## 2020-07-08 ENCOUNTER — Encounter (HOSPITAL_COMMUNITY): Payer: Self-pay

## 2020-07-08 ENCOUNTER — Emergency Department (HOSPITAL_COMMUNITY)
Admission: EM | Admit: 2020-07-08 | Discharge: 2020-07-08 | Disposition: A | Discharge status: Home / Self Care | Payer: Medicaid Other | Attending: Emergency Medicine | Admitting: Emergency Medicine

## 2020-07-08 ENCOUNTER — Other Ambulatory Visit: Payer: Self-pay

## 2020-07-08 DIAGNOSIS — R310 Gross hematuria: Secondary | ICD-10-CM | POA: Diagnosis not present

## 2020-07-08 DIAGNOSIS — E876 Hypokalemia: Secondary | ICD-10-CM | POA: Diagnosis not present

## 2020-07-08 DIAGNOSIS — R319 Hematuria, unspecified: Secondary | ICD-10-CM | POA: Diagnosis present

## 2020-07-08 LAB — URINALYSIS, ROUTINE W REFLEX MICROSCOPIC
Bacteria, UA: NONE SEEN
Bilirubin Urine: NEGATIVE
Glucose, UA: NEGATIVE mg/dL
Ketones, ur: NEGATIVE mg/dL
Nitrite: NEGATIVE
Protein, ur: 100 mg/dL — AB
RBC / HPF: >50 RBC/hpf — ABNORMAL HIGH (ref 0–5)
Specific Gravity, Urine: 1.023 (ref 1.005–1.030)
pH: 5 (ref 5.0–8.0)

## 2020-07-08 LAB — CBC
HCT: 35.2 % — ABNORMAL LOW (ref 36.0–46.0)
Hemoglobin: 9.3 g/dL — ABNORMAL LOW (ref 12.0–15.0)
MCH: 20.6 pg — ABNORMAL LOW (ref 26.0–34.0)
MCHC: 26.4 g/dL — ABNORMAL LOW (ref 30.0–36.0)
MCV: 78 fL — ABNORMAL LOW (ref 80.0–100.0)
Platelets: 191 10*3/uL (ref 150–400)
RBC: 4.51 MIL/uL (ref 3.87–5.11)
RDW: 22.1 % — ABNORMAL HIGH (ref 11.5–15.5)
WBC: 8.3 10*3/uL (ref 4.0–10.5)
nRBC: 0 % (ref 0.0–0.2)

## 2020-07-08 LAB — COMPREHENSIVE METABOLIC PANEL
ALT: 11 U/L (ref 0–44)
AST: 15 U/L (ref 15–41)
Albumin: 4.4 g/dL (ref 3.5–5.0)
Alkaline Phosphatase: 43 U/L (ref 38–126)
Anion gap: 10 (ref 5–15)
BUN: 16 mg/dL (ref 6–20)
CO2: 23 mmol/L (ref 22–32)
Calcium: 9.3 mg/dL (ref 8.9–10.3)
Chloride: 102 mmol/L (ref 98–111)
Creatinine, Ser: 0.61 mg/dL (ref 0.44–1.00)
GFR, Estimated: >60 mL/min (ref >60)
Glucose, Bld: 105 mg/dL — ABNORMAL HIGH (ref 70–99)
Potassium: 3.3 mmol/L — ABNORMAL LOW (ref 3.5–5.1)
Sodium: 135 mmol/L (ref 135–145)
Total Bilirubin: 0.3 mg/dL (ref 0.3–1.2)
Total Protein: 8.2 g/dL — ABNORMAL HIGH (ref 6.5–8.1)

## 2020-07-08 LAB — I-STAT BETA HCG BLOOD, ED (MC, WL, AP ONLY): I-stat hCG, quantitative: <5 m[IU]/mL (ref <5)

## 2020-07-08 NOTE — ED Provider Notes (Signed)
[facility] EMERGENCY DEPARTMENT Provider Note   CSN: [id number] Arrival date & time: 07/08/20  1453     History Chief Complaint  Patient presents with  . Hematuria    Shelley Merritt is a 29 y.o. female.  HPI      Shelley Merritt is a 29 y.o. female, with a history of anemia, presenting to the ED with frank hematuria which she initially states began yesterday. She endorses multiple episodes.  Later in the interview, she states she has actually had intermittent hematuria for the past year.  Denies fever/chills, abdominal pain, vaginal bleeding, hematochezia/melena, chest pain, shortness of breath, syncope, dizziness, dysuria, or any other complaints.  Past Medical History:  Diagnosis Date  . Anemia   . Anxiety    Panic Attacks, on meds  . Eczema   . Headache   . Infection    UTI    Patient Active Problem List   Diagnosis Date Noted  . S/P repeat low transverse C-section 02/03/2017    Past Surgical History:  Procedure Laterality Date  . CESAREAN SECTION  09/28/2011   Procedure: CESAREAN SECTION;  Surgeon: Bing Plume, MD;  Location: [facility] ORS;  Service: Gynecology;  Laterality: N/A;  Primary cesarean section with delivery of baby girl at  59. Apgars 9/10.  Marland Kitchen CESAREAN SECTION N/A 02/03/2017   Procedure: CESAREAN SECTION;  Surgeon: Huel Cote, MD;  Location: [facility] BIRTHING SUITES;  Service: Obstetrics;  Laterality: N/A;  . MOUTH SURGERY       OB History    Gravida  3   Para  2   Term  2   Preterm      AB  1   Living  2     SAB      IAB  1   Ectopic      Multiple  0   Live Births  2           Family History  Problem Relation Age of Onset  . Arthritis Mother   . Diabetes Father   . Heart disease Paternal Grandmother   . Hypertension Paternal Grandmother   . Diabetes Paternal Grandmother   . Arthritis Paternal Grandmother   . Diabetes Paternal Grandfather     Social History   Tobacco Use  . Smoking  status: Never Smoker  . Smokeless tobacco: Never Used  Substance Use Topics  . Alcohol use: No  . Drug use: No    Home Medications Prior to Admission medications   Medication Sig Start Date End Date Taking? Authorizing Provider  ferrous sulfate 325 (65 FE) MG tablet Take 1 tablet (325 mg total) by mouth 3 (three) times daily with meals. 06/05/20   Just, Azalee Course, FNP  QUEtiapine (SEROQUEL XR) 50 MG TB24 24 hr tablet Take 1 tablet (50 mg total) by mouth at bedtime. 05/31/20   Just, Azalee Course, FNP  Vitamin D, Ergocalciferol, (DRISDOL) 1.25 MG (50000 UNIT) CAPS capsule Take 1 capsule (50,000 Units total) by mouth every 7 (seven) days. 06/01/20   Just, Azalee Course, FNP    Allergies    Eggs or egg-derived products, Lexapro [escitalopram], and Latex  Review of Systems   Review of Systems  Constitutional: Negative for fever.  Respiratory: Negative for shortness of breath.   Cardiovascular: Negative for chest pain.  Gastrointestinal: Negative for abdominal pain, diarrhea, nausea and vomiting.  Genitourinary: Positive for hematuria. Negative for dysuria, flank pain, frequency and vaginal bleeding.  Musculoskeletal: Negative for back pain.  All other systems reviewed and are negative.   Physical Exam Updated Vital Signs BP 125/79 (BP Location: Right Arm)   Pulse (!) 107   Temp 98.3 F (36.8 C) (Oral)   Resp 16   SpO2 100%   Physical Exam Vitals and nursing note reviewed.  Constitutional:      General: She is not in acute distress.    Appearance: She is well-developed. She is not diaphoretic.  HENT:     Head: Normocephalic and atraumatic.     Mouth/Throat:     Mouth: Mucous membranes are moist.     Pharynx: Oropharynx is clear.  Eyes:     Conjunctiva/sclera: Conjunctivae normal.  Cardiovascular:     Rate and Rhythm: Normal rate and regular rhythm.     Pulses: Normal pulses.          Radial pulses are 2+ on the right side and 2+ on the left side.     Heart sounds: Normal heart  sounds.  Pulmonary:     Effort: Pulmonary effort is normal. No respiratory distress.     Breath sounds: Normal breath sounds.  Abdominal:     Palpations: Abdomen is soft.     Tenderness: There is no abdominal tenderness. There is no guarding.  Musculoskeletal:     Cervical back: Neck supple.  Skin:    General: Skin is warm and dry.  Neurological:     Mental Status: She is alert.  Psychiatric:        Mood and Affect: Mood and affect normal.        Speech: Speech normal.        Behavior: Behavior normal.     ED Results / Procedures / Treatments   Labs (all labs ordered are listed, but only abnormal results are displayed) Labs Reviewed  URINALYSIS, ROUTINE W REFLEX MICROSCOPIC - Abnormal; Notable for the following components:      Result Value   Color, Urine AMBER (*)    APPearance TURBID (*)    Hgb urine dipstick LARGE (*)    Protein, ur 100 (*)    Leukocytes,Ua MODERATE (*)    RBC / HPF >50 (*)    All other components within normal limits  CBC - Abnormal; Notable for the following components:   Hemoglobin 9.3 (*)    HCT 35.2 (*)    MCV 78.0 (*)    MCH 20.6 (*)    MCHC 26.4 (*)    RDW 22.1 (*)    All other components within normal limits  COMPREHENSIVE METABOLIC PANEL - Abnormal; Notable for the following components:   Potassium 3.3 (*)    Glucose, Bld 105 (*)    Total Protein 8.2 (*)    All other components within normal limits  I-STAT BETA HCG BLOOD, ED ([facility], [facility], [facility] ONLY)   Hemoglobin  Date Value Ref Range Status  07/08/2020 9.3 (L) 12.0 - 15.0 g/dL Final  29/92/4268 8.0 (L) 11.1 - 15.9 g/dL Final  34/19/6222 7.0 (LL) 11.1 - 15.9 g/dL Final  97/98/9211 94.1 12.0 - 15.0 g/dL Final  74/02/1447 18.5 12.0 - 15.0 g/dL Final  63/14/9702 63.7 (L) 12.0 - 15.0 g/dL Final    EKG None  Radiology No results found.  Procedures Procedures (including critical care time)  Medications Ordered in ED Medications - No data to display  ED Course  I have reviewed the  triage vital signs and the nursing notes.  Pertinent labs & imaging results that were available during my care  of the patient were reviewed by me and considered in my medical decision making (see chart for details).    MDM Rules/Calculators/A&P                          Patient presents with complaint of hematuria. Patient is nontoxic appearing, afebrile, not tachycardic on my exam, not tachypneic, not hypotensive, maintains excellent SPO2 on room air, and is in no apparent distress.  I personally reviewed and interpreted the patient's lab work. Mild hypokalemia.  Discussed dietary changes. Urology follow-up. Return precautions discussed.  Patient voices understanding of these instructions, accepts the plan, and is comfortable with discharge.   Final Clinical Impression(s) / ED Diagnoses Final diagnoses:  Gross hematuria  Hypokalemia    Rx / DC Orders ED Discharge Orders    None       Concepcion Living 07/10/20 0057    Terrilee Files, MD 07/10/20 [phone]

## 2020-07-08 NOTE — ED Triage Notes (Signed)
Pt reports hematuria since Friday, denies any pain. Pt very anxious in triage

## 2020-07-08 NOTE — Discharge Instructions (Addendum)
The blood in the urine needs further evaluation.  This needs to be done by a urologist.  Call the number provided to set up an appointment.  Your potassium was slightly low.  Refer to the attached information for foods that can be eaten to increase potassium.  Have your potassium rechecked by primary care provider.

## 2020-07-10 ENCOUNTER — Encounter: Payer: Self-pay | Admitting: Family Medicine

## 2020-07-11 ENCOUNTER — Ambulatory Visit (INDEPENDENT_AMBULATORY_CARE_PROVIDER_SITE_OTHER): Payer: Medicaid Other | Admitting: Family Medicine

## 2020-07-11 ENCOUNTER — Other Ambulatory Visit: Payer: Self-pay

## 2020-07-11 ENCOUNTER — Encounter: Payer: Self-pay | Admitting: Family Medicine

## 2020-07-11 VITALS — BP 104/69 | HR 104 | Temp 97.9°F | Ht 62.0 in | Wt 116.0 lb

## 2020-07-11 DIAGNOSIS — D509 Iron deficiency anemia, unspecified: Secondary | ICD-10-CM

## 2020-07-11 DIAGNOSIS — R319 Hematuria, unspecified: Secondary | ICD-10-CM

## 2020-07-11 LAB — POCT WET + KOH PREP
Trich by wet prep: ABSENT
Yeast by KOH: ABSENT
Yeast by wet prep: ABSENT

## 2020-07-11 LAB — POCT URINALYSIS DIP (MANUAL ENTRY)
Bilirubin, UA: NEGATIVE
Glucose, UA: NEGATIVE mg/dL
Ketones, POC UA: NEGATIVE mg/dL
Nitrite, UA: NEGATIVE
Protein Ur, POC: 30 mg/dL — AB
Spec Grav, UA: >=1.03 — AB (ref 1.010–1.025)
Urobilinogen, UA: 0.2 E.U./dL
pH, UA: 5.5 (ref 5.0–8.0)

## 2020-07-11 LAB — POC MICROSCOPIC URINALYSIS (UMFC): Mucus: ABSENT

## 2020-07-11 MED ORDER — NITROFURANTOIN MONOHYD MACRO 100 MG PO CAPS: 100.0000 mg | ORAL_CAPSULE | Freq: Two times a day (BID) | ORAL | 0 refills | Status: AC

## 2020-07-11 NOTE — Patient Instructions (Addendum)
Take Daily Vitamin D3 at least 2000 IU per day with food  Continue to take Iron daily  Take Macrobid twice a day for 5 days  Hematology will be calling you to schedule appointment   Urinary Tract Infection, Adult A urinary tract infection (UTI) is an infection of any part of the urinary tract. The urinary tract includes:  The kidneys.  The ureters.  The bladder.  The urethra. These organs make, store, and get rid of pee (urine) in the body. What are the causes? This is caused by germs (bacteria) in your genital area. These germs grow and cause swelling (inflammation) of your urinary tract. What increases the risk? You are more likely to develop this condition if:  You have a small, thin tube (catheter) to drain pee.  You cannot control when you pee or poop (incontinence).  You are female, and: ? You use these methods to prevent pregnancy:  A medicine that kills sperm (spermicide).  A device that blocks sperm (diaphragm). ? You have low levels of a female hormone (estrogen). ? You are pregnant.  You have genes that add to your risk.  You are sexually active.  You take antibiotic medicines.  You have trouble peeing because of: ? A prostate that is bigger than normal, if you are female. ? A blockage in the part of your body that drains pee from the bladder (urethra). ? A kidney stone. ? A nerve condition that affects your bladder (neurogenic bladder). ? Not getting enough to drink. ? Not peeing often enough.  You have other conditions, such as: ? Diabetes. ? A weak disease-fighting system (immune system). ? Sickle cell disease. ? Gout. ? Injury of the spine. What are the signs or symptoms? Symptoms of this condition include:  Needing to pee right away (urgently).  Peeing often.  Peeing small amounts often.  Pain or burning when peeing.  Blood in the pee.  Pee that smells bad or not like normal.  Trouble peeing.  Pee that is cloudy.  Fluid  coming from the vagina, if you are female.  Pain in the belly or lower back. Other symptoms include:  Throwing up (vomiting).  No urge to eat.  Feeling mixed up (confused).  Being tired and grouchy (irritable).  A fever.  Watery poop (diarrhea). How is this treated? This condition may be treated with:  Antibiotic medicine.  Other medicines.  Drinking enough water. Follow these instructions at home:  Medicines  Take over-the-counter and prescription medicines only as told by your doctor.  If you were prescribed an antibiotic medicine, take it as told by your doctor. Do not stop taking it even if you start to feel better. General instructions  Make sure you: ? Pee until your bladder is empty. ? Do not hold pee for a long time. ? Empty your bladder after sex. ? Wipe from front to back after pooping if you are a female. Use each tissue one time when you wipe.  Drink enough fluid to keep your pee pale yellow.  Keep all follow-up visits as told by your doctor. This is important. Contact a doctor if:  You do not get better after 1-2 days.  Your symptoms go away and then come back. Get help right away if:  You have very bad back pain.  You have very bad pain in your lower belly.  You have a fever.  You are sick to your stomach (nauseous).  You are throwing up. Summary  A urinary tract  infection (UTI) is an infection of any part of the urinary tract.  This condition is caused by germs in your genital area.  There are many risk factors for a UTI. These include having a small, thin tube to drain pee and not being able to control when you pee or poop.  Treatment includes antibiotic medicines for germs.  Drink enough fluid to keep your pee pale yellow. This information is not intended to replace advice given to you by your health care provider. Make sure you discuss any questions you have with your health care provider. Document Revised: 06/18/2018 Document  Reviewed: 01/08/2018 Elsevier Patient Education  [employer].    If you have lab work done today you will be contacted with your lab results within the next 2 weeks.  If you have not heard from [place] then please contact us. The fastest way to get your results is to register for My Chart.   IF you received an x-ray today, you will receive an invoice from [facility]. Please contact [facility] at [phone] with questions or concerns regarding your invoice.   IF you received labwork today, you will receive an invoice from [facility]. Please contact [facility] at [phone] with questions or concerns regarding your invoice.   Our billing staff will not be able to assist you with questions regarding bills from these companies.  You will be contacted with the lab results as soon as they are available. The fastest way to get your results is to activate your My Chart account. Instructions are located on the last page of this paperwork. If you have not heard from [place] regarding the results in 2 weeks, please contact this office.

## 2020-07-11 NOTE — Progress Notes (Signed)
12/28/20212:22 PM  Shelley Merritt 06-13-1991, 29 y.o., female [id number]  Chief Complaint  Patient presents with  . Hematuria    Last bloody urine 4 days ago, last episode in November, urinary urgency , Pt was referred to urologist by hospitalist -has an appt    HPI:   Patient is a 29 y.o. female with past medical history significant for IDA and anxiety who presents today for hematuria.    Anemia Taking Iron Daily CBC EXTENDED Latest Ref Rng & Units 07/08/2020 06/23/2020 05/31/2020  WBC 4.0 - 10.5 K/uL 8.3 6.2 4.3  RBC 3.87 - 5.11 MIL/uL 4.51 3.93 3.74(L)  HGB 12.0 - 15.0 g/dL 6.6(Y) 4.0(H) 7.0(LL)  HCT 36.0 - 46.0 % 35.2(L) 28.5(L) 26.3(L)  PLT 150 - 400 K/uL 191 381 273  NEUTROABS 1.4 - 7.0 x10E3/uL - 3.5 -  LYMPHSABS 0.7 - 3.1 x10E3/uL - 2.1 -    Recently seen in the ED for hematuria Was referred to Urology (Friday Jan 7th) Still having dark urine Has been working on improving diet  Last vitamin D Lab Results  Component Value Date   VD25OH 12.9 (L) 05/31/2020     Depression screen PHQ 2/9 07/11/2020  Decreased Interest 0  Down, Depressed, Hopeless 0  PHQ - 2 Score 0    Fall Risk  07/11/2020  Falls in the past year? 0  Number falls in past yr: 0  Injury with Fall? 0  Follow up Falls evaluation completed     Allergies  Allergen Reactions  . Eggs Or Egg-Derived Products Swelling  . Lexapro [Escitalopram] Swelling  . Latex Rash    Prior to Admission medications   Medication Sig Start Date End Date Taking? Authorizing Provider  ferrous sulfate 325 (65 FE) MG tablet Take 1 tablet (325 mg total) by mouth 3 (three) times daily with meals. 06/05/20   Rikita Grabert, Azalee Course, FNP  QUEtiapine (SEROQUEL XR) 50 MG TB24 24 hr tablet Take 1 tablet (50 mg total) by mouth at bedtime. 05/31/20   Mariaguadalupe Fialkowski, Azalee Course, FNP  Vitamin D, Ergocalciferol, (DRISDOL) 1.25 MG (50000 UNIT) CAPS capsule Take 1 capsule (50,000 Units total) by mouth every 7 (seven) days. 06/01/20   Lorenna Lurry,  Azalee Course, FNP    Past Medical History:  Diagnosis Date  . Anemia   . Anxiety    Panic Attacks, on meds  . Eczema   . Headache   . Infection    UTI    Past Surgical History:  Procedure Laterality Date  . CESAREAN SECTION  09/28/2011   Procedure: CESAREAN SECTION;  Surgeon: Bing Plume, MD;  Location: [facility];  Service: Gynecology;  Laterality: N/A;  Primary cesarean section with delivery of baby girl at  105. Apgars 9/10.  Marland Kitchen CESAREAN SECTION N/A 02/03/2017   Procedure: CESAREAN SECTION;  Surgeon: Huel Cote, MD;  Location: [facility];  Service: Obstetrics;  Laterality: N/A;  . MOUTH SURGERY      Social History   Tobacco Use  . Smoking status: Never Smoker  . Smokeless tobacco: Never Used  Substance Use Topics  . Alcohol use: No    Family History  Problem Relation Age of Onset  . Arthritis Mother   . Diabetes Father   . Heart disease Paternal Grandmother   . Hypertension Paternal Grandmother   . Diabetes Paternal Grandmother   . Arthritis Paternal Grandmother   . Diabetes Paternal Grandfather     Review of Systems  Respiratory: Negative for cough, shortness of breath  and wheezing.   Cardiovascular: Negative for chest pain and palpitations.  Gastrointestinal: Negative for abdominal pain, heartburn, nausea and vomiting.  Genitourinary: Positive for frequency, hematuria and urgency. Negative for dysuria and flank pain.  Musculoskeletal: Negative for back pain.  Neurological: Negative for headaches.     OBJECTIVE:  Today's Vitals   07/11/20 1314  BP: 104/69  Pulse: (!) 104  Temp: 97.9 F (36.6 C)  SpO2: 99%  Weight: 116 lb (52.6 kg)  Height: 5\' 2"  (1.575 m)   Body mass index is 21.22 kg/m.   Physical Exam Constitutional:      General: She is not in acute distress.    Appearance: Normal appearance. She is not ill-appearing.  HENT:     Head: Normocephalic.  Cardiovascular:     Rate and Rhythm: Normal rate and regular rhythm.      Pulses: Normal pulses.     Heart sounds: Normal heart sounds. No murmur heard. No friction rub. No gallop.   Pulmonary:     Effort: Pulmonary effort is normal. No respiratory distress.     Breath sounds: Normal breath sounds. No stridor. No wheezing, rhonchi or rales.  Abdominal:     General: Bowel sounds are normal. There is no distension.     Palpations: Abdomen is soft.     Tenderness: There is no abdominal tenderness. There is no right CVA tenderness, left CVA tenderness or guarding.  Musculoskeletal:     Right lower leg: No edema.     Left lower leg: No edema.  Skin:    General: Skin is warm and dry.  Neurological:     Mental Status: She is alert and oriented to person, place, and time.  Psychiatric:        Mood and Affect: Mood normal.        Behavior: Behavior normal.     Results for orders placed or performed in visit on 07/11/20 (from the past 24 hour(s))  POCT urinalysis dipstick     Status: Abnormal   Collection Time: 07/11/20  1:35 PM  Result Value Ref Range   Color, UA straw (A) yellow   Clarity, UA clear clear   Glucose, UA negative negative mg/dL   Bilirubin, UA negative negative   Ketones, POC UA negative negative mg/dL   Spec Grav, UA 07/13/20 (A) 1.010 - 1.025   Blood, UA small (A) negative   pH, UA 5.5 5.0 - 8.0   Protein Ur, POC =30 (A) negative mg/dL   Urobilinogen, UA 0.2 0.2 or 1.0 E.U./dL   Nitrite, UA Negative Negative   Leukocytes, UA Moderate (2+) (A) Negative  POCT Wet + KOH Prep     Status: Abnormal   Collection Time: 07/11/20  1:49 PM  Result Value Ref Range   Yeast by KOH Absent Absent   Yeast by wet prep Absent Absent   WBC by wet prep None (A) Few   Clue Cells Wet Prep HPF POC None None   Trich by wet prep Absent Absent   Bacteria Wet Prep HPF POC Few Few   Epithelial Cells By 07/13/20 Pref ([facility]) Moderate (A) None, Few, Too numerous to count   RBC,UR,HPF,POC None None RBC/hpf  POCT Microscopic Urinalysis ([facility])     Status: Abnormal    Collection Time: 07/11/20  2:01 PM  Result Value Ref Range   WBC,UR,HPF,POC Moderate (A) None WBC/hpf   RBC,UR,HPF,POC Moderate (A) None RBC/hpf   Bacteria Few (A) None, Too numerous to count   Mucus Absent  Absent   Epithelial Cells, UR Per Microscopy Few (A) None, Too numerous to count cells/hpf    No results found.   ASSESSMENT and PLAN  Problem List Items Addressed This Visit      Other   Iron deficiency anemia   Relevant Orders   Ambulatory referral to Hematology  Encouraged to continue daily iron Increase Iron in diet as able    Other Visit Diagnoses    Hematuria, unspecified type    -  Primary   Relevant Medications   nitrofurantoin, macrocrystal-monohydrate, (MACROBID) 100 MG capsule   Other Relevant Orders   POCT urinalysis dipstick (Completed)   POCT Wet + KOH Prep (Completed)   POCT Microscopic Urinalysis ([facility]) (Completed) Urine Culture   Treat for UTI Will see Urology in January       Return if symptoms worsen or fail to improve, for Next scheduled appointment in Feb.    Macario Carls Mistee Soliman, FNP-BC Primary Care at [facility] [address] [place], [place] [address] Ph.  [phone] Fax [phone]

## 2020-07-18 ENCOUNTER — Other Ambulatory Visit: Payer: Self-pay | Admitting: Family Medicine

## 2020-07-18 DIAGNOSIS — N3001 Acute cystitis with hematuria: Secondary | ICD-10-CM

## 2020-07-18 LAB — URINE CULTURE

## 2020-07-18 MED ORDER — SULFAMETHOXAZOLE-TRIMETHOPRIM 800-160 MG PO TABS: 1.0000 | ORAL_TABLET | Freq: Two times a day (BID) | ORAL | 0 refills | Status: AC

## 2020-07-18 NOTE — Progress Notes (Signed)
If you could let Shelley Merritt know her urine culture report Shelley Merritt came back and it looks like her UTI only had intermediate success with the Macrobid she was placed on for this. I am sending Bactrim to her pharmacy for her to take twice a day for 5 days.

## 2020-07-24 ENCOUNTER — Encounter: Payer: Self-pay | Admitting: Family Medicine

## 2020-08-31 ENCOUNTER — Ambulatory Visit: Payer: Medicaid Other | Admitting: Family Medicine

## 2020-09-04 ENCOUNTER — Ambulatory Visit: Payer: Medicaid Other | Admitting: Family Medicine

## 2020-09-06 ENCOUNTER — Ambulatory Visit: Payer: Medicaid Other | Admitting: Family Medicine

## 2020-09-06 ENCOUNTER — Encounter: Payer: Self-pay | Admitting: Family Medicine

## 2020-09-06 VITALS — BP 105/73 | HR 80 | Temp 98.1°F | Ht 62.0 in | Wt 117.6 lb

## 2020-09-06 DIAGNOSIS — D509 Iron deficiency anemia, unspecified: Secondary | ICD-10-CM | POA: Diagnosis not present

## 2020-09-06 NOTE — Progress Notes (Signed)
2/23/20225:02 PM  Shelley Merritt 12/31/1990, 30 y.o., female [id number]  Chief Complaint  Patient presents with  . Anemia    HPI:   Patient is a 30 y.o. female with past medical history significant for IDA who presents today for routine follow up.  No acute issues at this time Has added chia seeds to her diet Has switched to almond milk Still working nights Has been working part time Continues to follow up with urology for urinary frequency and urgency Recently bought a [place] pig Continues daily Iron and vitamin D Lab Results  Component Value Date   WBC 8.3 07/08/2020   HGB 9.3 (L) 07/08/2020   HCT 35.2 (L) 07/08/2020   MCV 78.0 (L) 07/08/2020   PLT 191 07/08/2020   Last vitamin D Lab Results  Component Value Date   VD25OH 12.9 (L) 05/31/2020     Health Maintenance  Topic Date Due  . TETANUS/TDAP  Never done  . PAP SMEAR-Modifier  Never done  . INFLUENZA VACCINE  10/12/2020 (Originally 02/13/2020)  . Hepatitis C Screening  Completed  . HIV Screening  Completed     Depression screen [facility] 2/9 09/06/2020 07/11/2020  Decreased Interest 0 0  Down, Depressed, Hopeless 0 0  PHQ - 2 Score 0 0    Fall Risk  09/06/2020 07/11/2020  Falls in the past year? 0 0  Number falls in past yr: 0 0  Injury with Fall? 0 0  Follow up Falls evaluation completed Falls evaluation completed     Allergies  Allergen Reactions  . Eggs Or Egg-Derived Products Swelling  . Lexapro [Escitalopram] Swelling  . Latex Rash    Prior to Admission medications   Medication Sig Start Date End Date Taking? Authorizing Provider  ferrous sulfate 325 (65 FE) MG tablet Take 1 tablet (325 mg total) by mouth 3 (three) times daily with meals. 06/05/20  Yes , Laurita Quint, FNP  QUEtiapine (SEROQUEL XR) 50 MG TB24 24 hr tablet Take 1 tablet (50 mg total) by mouth at bedtime. 05/31/20  Yes , Laurita Quint, FNP  Vitamin D, Ergocalciferol, (DRISDOL) 1.25 MG (50000 UNIT) CAPS capsule Take 1 capsule  (50,000 Units total) by mouth every 7 (seven) days. 06/01/20  Yes , Laurita Quint, FNP    Past Medical History:  Diagnosis Date  . Anemia   . Anxiety    Panic Attacks, on meds  . Eczema   . Headache   . Infection    UTI    Past Surgical History:  Procedure Laterality Date  . CESAREAN SECTION  09/28/2011   Procedure: CESAREAN SECTION;  Surgeon: Melina Schools, MD;  Location: [facility];  Service: Gynecology;  Laterality: N/A;  Primary cesarean section with delivery of baby girl at  49. Apgars 9/10.  Marland Kitchen CESAREAN SECTION N/A 02/03/2017   Procedure: CESAREAN SECTION;  Surgeon: Paula Compton, MD;  Location: [facility];  Service: Obstetrics;  Laterality: N/A;  . MOUTH SURGERY      Social History   Tobacco Use  . Smoking status: Never Smoker  . Smokeless tobacco: Never Used  Substance Use Topics  . Alcohol use: No    Family History  Problem Relation Age of Onset  . Arthritis Mother   . Diabetes Father   . Heart disease Paternal Grandmother   . Hypertension Paternal Grandmother   . Diabetes Paternal Grandmother   . Arthritis Paternal Grandmother   . Diabetes Paternal Grandfather     Review of Systems  Constitutional:  Negative for chills, fever and malaise/fatigue.  Eyes: Negative for blurred vision and double vision.  Respiratory: Negative for cough, shortness of breath and wheezing.   Cardiovascular: Negative for chest pain, palpitations and leg swelling.  Gastrointestinal: Negative for abdominal pain, blood in stool, constipation, diarrhea, heartburn, nausea and vomiting.  Genitourinary: Positive for frequency and urgency. Negative for dysuria and hematuria.  Musculoskeletal: Negative for back pain and joint pain.  Skin: Negative for rash.  Neurological: Negative for dizziness, weakness and headaches.     OBJECTIVE:  Today's Vitals   09/06/20 1640  BP: 105/73  Pulse: 80  Temp: 98.1 F (36.7 C)  SpO2: 97%  Weight: 117 lb 9.6 oz (53.3 kg)  Height: 5'  2" (1.575 m)   Body mass index is 21.51 kg/m.   Physical Exam Constitutional:      General: She is not in acute distress.    Appearance: Normal appearance. She is not ill-appearing.  HENT:     Head: Normocephalic.  Cardiovascular:     Rate and Rhythm: Normal rate and regular rhythm.     Pulses: Normal pulses.     Heart sounds: Normal heart sounds. No murmur heard. No friction rub. No gallop.   Pulmonary:     Effort: Pulmonary effort is normal. No respiratory distress.     Breath sounds: Normal breath sounds. No stridor. No wheezing, rhonchi or rales.  Abdominal:     General: Bowel sounds are normal.     Palpations: Abdomen is soft.     Tenderness: There is no abdominal tenderness.  Musculoskeletal:     Right lower leg: No edema.     Left lower leg: No edema.  Skin:    General: Skin is warm and dry.  Neurological:     Mental Status: She is alert and oriented to person, place, and time.  Psychiatric:        Mood and Affect: Mood normal.        Behavior: Behavior normal.     No results found for this or any previous visit (from the past 24 hour(s)).  No results found.   ASSESSMENT and PLAN  Problem List Items Addressed This Visit      Other   Iron deficiency anemia - Primary   Relevant Orders   CBC with Differential   Iron, TIBC and Ferritin Panel   CMP14+EGFR   Vitamin D, 25-hydroxy   Hepatitis C antibody      Plan . Schedule pap when able . Will follow up with lab results   Return for PaP when able.    Huston Foley Yadier Bramhall, FNP-BC Primary Care at [place], [place] [address] Ph.  [phone] Fax [phone]

## 2020-09-06 NOTE — Patient Instructions (Addendum)
  Health Maintenance, Female Adopting a healthy lifestyle and getting preventive care are important in promoting health and wellness. Ask your health care provider about:  The right schedule for you to have regular tests and exams.  Things you can do on your own to prevent diseases and keep yourself healthy. What should I know about diet, weight, and exercise? Eat a healthy diet  Eat a diet that includes plenty of vegetables, fruits, low-fat dairy products, and lean protein.  Do not eat a lot of foods that are high in solid fats, added sugars, or sodium.   Maintain a healthy weight Body mass index (BMI) is used to identify weight problems. It estimates body fat based on height and weight. Your health care provider can help determine your BMI and help you achieve or maintain a healthy weight. Get regular exercise Get regular exercise. This is one of the most important things you can do for your health. Most adults should:  Exercise for at least 150 minutes each week. The exercise should increase your heart rate and make you sweat (moderate-intensity exercise).  Do strengthening exercises at least twice a week. This is in addition to the moderate-intensity exercise.  Spend less time sitting. Even light physical activity can be beneficial. Watch cholesterol and blood lipids Have your blood tested for lipids and cholesterol at 32 years of age, then have this test every 5 years. Have your cholesterol levels checked more often if:  Your lipid or cholesterol levels are high.  You are older than 32 years of age.  You are at high risk for heart disease. What should I know about cancer screening? Depending on your health history and family history, you may need to have cancer screening at various ages. This may include screening for:  Breast cancer.  Cervical cancer.  Colorectal cancer.  Skin cancer.  Lung cancer. What should I know about heart disease, diabetes, and high blood  pressure? Blood pressure and heart disease  High blood pressure causes heart disease and increases the risk of stroke. This is more likely to develop in people who have high blood pressure readings, are of African descent, or are overweight.  Have your blood pressure checked: ? Every 3-5 years if you are 18-39 years of age. ? Every year if you are 40 years old or older. Diabetes Have regular diabetes screenings. This checks your fasting blood sugar level. Have the screening done:  Once every three years after age 40 if you are at a normal weight and have a low risk for diabetes.  More often and at a younger age if you are overweight or have a high risk for diabetes. What should I know about preventing infection? Hepatitis B If you have a higher risk for hepatitis B, you should be screened for this virus. Talk with your health care provider to find out if you are at risk for hepatitis B infection. Hepatitis C Testing is recommended for:  Everyone born from 1945 through 1965.  Anyone with known risk factors for hepatitis C. Sexually transmitted infections (STIs)  Get screened for STIs, including gonorrhea and chlamydia, if: ? You are sexually active and are younger than 32 years of age. ? You are older than 32 years of age and your health care provider tells you that you are at risk for this type of infection. ? Your sexual activity has changed since you were last screened, and you are at increased risk for chlamydia or gonorrhea. Ask your health   care provider if you are at risk.  Ask your health care provider about whether you are at high risk for HIV. Your health care provider may recommend a prescription medicine to help prevent HIV infection. If you choose to take medicine to prevent HIV, you should first get tested for HIV. You should then be tested every 3 months for as long as you are taking the medicine. Pregnancy  If you are about to stop having your period (premenopausal) and  you may become pregnant, seek counseling before you get pregnant.  Take 400 to 800 micrograms (mcg) of folic acid every day if you become pregnant.  Ask for birth control (contraception) if you want to prevent pregnancy. Osteoporosis and menopause Osteoporosis is a disease in which the bones lose minerals and strength with aging. This can result in bone fractures. If you are 65 years old or older, or if you are at risk for osteoporosis and fractures, ask your health care provider if you should:  Be screened for bone loss.  Take a calcium or vitamin D supplement to lower your risk of fractures.  Be given hormone replacement therapy (HRT) to treat symptoms of menopause. Follow these instructions at home: Lifestyle  Do not use any products that contain nicotine or tobacco, such as cigarettes, e-cigarettes, and chewing tobacco. If you need help quitting, ask your health care provider.  Do not use street drugs.  Do not share needles.  Ask your health care provider for help if you need support or information about quitting drugs. Alcohol use  Do not drink alcohol if: ? Your health care provider tells you not to drink. ? You are pregnant, may be pregnant, or are planning to become pregnant.  If you drink alcohol: ? Limit how much you use to 0-1 drink a day. ? Limit intake if you are breastfeeding.  Be aware of how much alcohol is in your drink. In the [place] one drink equals one 12 oz bottle of beer (355 mL), one 5 oz glass of wine (148 mL), or one 1 oz glass of hard liquor (44 mL). General instructions  Schedule regular health, dental, and eye exams.  Stay current with your vaccines.  Tell your health care provider if: ? You often feel depressed. ? You have ever been abused or do not feel safe at home. Summary  Adopting a healthy lifestyle and getting preventive care are important in promoting health and wellness.  Follow your health care provider's instructions about healthy  diet, exercising, and getting tested or screened for diseases.  Follow your health care provider's instructions on monitoring your cholesterol and blood pressure. This information is not intended to replace advice given to you by your health care provider. Make sure you discuss any questions you have with your health care provider. Document Revised: 06/24/2018 Document Reviewed: 06/24/2018 Elsevier Patient Education  2021 [employer]vier Inc.   If you have lab work done today you will be contacted with your lab results within the next 2 weeks.  If you have not heard from us then please contact us. The fastest way to get your results is to register for My Chart.   IF you received an x-ray today, you will receive an invoice from [facility]. Please contact [facility] at [phone] with questions or concerns regarding your invoice.   IF you received labwork today, you will receive an invoice from [facility]. Please contact [facility] at [phone] with questions or concerns regarding your invoice.   Our billing   staff will not be able to assist you with questions regarding bills from these companies.  You will be contacted with the lab results as soon as they are available. The fastest way to get your results is to activate your My Chart account. Instructions are located on the last page of this paperwork. If you have not heard from us regarding the results in 2 weeks, please contact this office.      

## 2020-09-07 LAB — CMP14+EGFR
ALT: 8 IU/L (ref 0–32)
AST: 16 IU/L (ref 0–40)
Albumin/Globulin Ratio: 1.5 (ref 1.2–2.2)
Albumin: 4.6 g/dL (ref 3.8–4.8)
Alkaline Phosphatase: 52 IU/L (ref 44–121)
BUN/Creatinine Ratio: 22 (ref 9–23)
BUN: 13 mg/dL (ref 6–20)
Bilirubin Total: <0.2 mg/dL (ref 0.0–1.2)
CO2: 23 mmol/L (ref 20–29)
Calcium: 9.4 mg/dL (ref 8.7–10.2)
Chloride: 102 mmol/L (ref 96–106)
Creatinine, Ser: 0.59 mg/dL (ref 0.57–1.00)
GFR calc Af Amer: 141 mL/min/{1.73_m2} (ref >59)
GFR calc non Af Amer: 123 mL/min/{1.73_m2} (ref >59)
Globulin, Total: 3 g/dL (ref 1.5–4.5)
Glucose: 88 mg/dL (ref 65–99)
Potassium: 4.1 mmol/L (ref 3.5–5.2)
Sodium: 140 mmol/L (ref 134–144)
Total Protein: 7.6 g/dL (ref 6.0–8.5)

## 2020-09-07 LAB — CBC WITH DIFFERENTIAL/PLATELET
Basophils Absolute: 0.1 10*3/uL (ref 0.0–0.2)
Basos: 1 %
EOS (ABSOLUTE): 0.1 10*3/uL (ref 0.0–0.4)
Eos: 1 %
Hematocrit: 35.7 % (ref 34.0–46.6)
Hemoglobin: 10.9 g/dL — ABNORMAL LOW (ref 11.1–15.9)
Immature Grans (Abs): 0 10*3/uL (ref 0.0–0.1)
Immature Granulocytes: 0 %
Lymphocytes Absolute: 1.8 10*3/uL (ref 0.7–3.1)
Lymphs: 35 %
MCH: 25.3 pg — ABNORMAL LOW (ref 26.6–33.0)
MCHC: 30.5 g/dL — ABNORMAL LOW (ref 31.5–35.7)
MCV: 83 fL (ref 79–97)
Monocytes Absolute: 0.3 10*3/uL (ref 0.1–0.9)
Monocytes: 7 %
Neutrophils Absolute: 2.8 10*3/uL (ref 1.4–7.0)
Neutrophils: 56 %
Platelets: 217 10*3/uL (ref 150–450)
RBC: 4.31 x10E6/uL (ref 3.77–5.28)
RDW: 17.1 % — ABNORMAL HIGH (ref 11.7–15.4)
WBC: 5.1 10*3/uL (ref 3.4–10.8)

## 2020-09-07 LAB — IRON,TIBC AND FERRITIN PANEL
Ferritin: 10 ng/mL — ABNORMAL LOW (ref 15–150)
Iron Saturation: 3 % — CL (ref 15–55)
Iron: 15 ug/dL — ABNORMAL LOW (ref 27–159)
Total Iron Binding Capacity: 445 ug/dL (ref 250–450)
UIBC: 430 ug/dL — ABNORMAL HIGH (ref 131–425)

## 2020-09-07 LAB — HEPATITIS C ANTIBODY: Hep C Virus Ab: <0.1 s/co ratio (ref 0.0–0.9)

## 2020-09-07 LAB — VITAMIN D 25 HYDROXY (VIT D DEFICIENCY, FRACTURES): Vit D, 25-Hydroxy: 30.1 ng/mL (ref 30.0–100.0)

## 2020-09-27 ENCOUNTER — Ambulatory Visit: Payer: Medicaid Other | Admitting: Family Medicine

## 2020-10-03 ENCOUNTER — Ambulatory Visit: Payer: Medicaid Other | Admitting: Family Medicine

## 2020-10-04 ENCOUNTER — Other Ambulatory Visit: Payer: Self-pay

## 2020-10-04 ENCOUNTER — Ambulatory Visit (INDEPENDENT_AMBULATORY_CARE_PROVIDER_SITE_OTHER): Payer: Medicaid Other | Admitting: Family Medicine

## 2020-10-04 ENCOUNTER — Other Ambulatory Visit (HOSPITAL_COMMUNITY)
Admission: RE | Admit: 2020-10-04 | Discharge: 2020-10-04 | Disposition: A | Discharge status: Home / Self Care | Payer: Medicaid Other | Source: Ambulatory Visit | Attending: Family Medicine | Admitting: Family Medicine

## 2020-10-04 ENCOUNTER — Encounter: Payer: Self-pay | Admitting: Family Medicine

## 2020-10-04 VITALS — BP 104/68 | HR 87 | Temp 98.4°F | Ht 62.0 in | Wt 122.0 lb

## 2020-10-04 DIAGNOSIS — Z124 Encounter for screening for malignant neoplasm of cervix: Secondary | ICD-10-CM

## 2020-10-04 NOTE — Progress Notes (Signed)
3/23/20229:55 AM  Shelley Merritt Mar 18, 1991, 30 y.o., female [id number]  Chief Complaint  Patient presents with  . Gynecologic Exam    HPI:   Patient is a 30 y.o. female with past medical history significant for IDA who presents today for pap.  Has regular periods Denies painful or heavy They come monthly Not on birth control Not sexually active at this time Does want testing for STI Has never had an abnormal pap No pap on file  Denies urinary symptoms and vaginal discharge Frequency has improved on Oxybutynin  Lab Results  Component Value Date   WBC 5.1 09/06/2020   HGB 10.9 (L) 09/06/2020   HCT 35.7 09/06/2020   MCV 83 09/06/2020   PLT 217 09/06/2020   Last vitamin D Lab Results  Component Value Date   VD25OH 30.1 09/06/2020     Health Maintenance  Topic Date Due  . TETANUS/TDAP  Never done  . PAP SMEAR-Modifier  Never done  . INFLUENZA VACCINE  10/12/2020 (Originally 02/13/2020)  . Hepatitis C Screening  Completed  . HIV Screening  Completed  . HPV VACCINES  Aged Out     Depression screen [facility] 2/9 10/04/2020 09/06/2020 07/11/2020  Decreased Interest 0 0 0  Down, Depressed, Hopeless 0 0 0  PHQ - 2 Score 0 0 0    Fall Risk  10/04/2020 09/06/2020 07/11/2020  Falls in the past year? 0 0 0  Number falls in past yr: 0 0 0  Injury with Fall? 0 0 0  Follow up Falls evaluation completed Falls evaluation completed Falls evaluation completed     Allergies  Allergen Reactions  . Eggs Or Egg-Derived Products Swelling  . Lexapro [Escitalopram] Swelling  . Latex Rash    Prior to Admission medications   Medication Sig Start Date End Date Taking? Authorizing Provider  ferrous sulfate 325 (65 FE) MG tablet Take 1 tablet (325 mg total) by mouth 3 (three) times daily with meals. 06/05/20  Yes Amun Stemm, Azalee Course, FNP  QUEtiapine (SEROQUEL XR) 50 MG TB24 24 hr tablet Take 1 tablet (50 mg total) by mouth at bedtime. 05/31/20  Yes Arya Boxley, Azalee Course, FNP  Vitamin D,  Ergocalciferol, (DRISDOL) 1.25 MG (50000 UNIT) CAPS capsule Take 1 capsule (50,000 Units total) by mouth every 7 (seven) days. 06/01/20  Yes Drea Jurewicz, Azalee Course, FNP    Past Medical History:  Diagnosis Date  . Anemia   . Anxiety    Panic Attacks, on meds  . Eczema   . Headache   . Infection    UTI    Past Surgical History:  Procedure Laterality Date  . CESAREAN SECTION  09/28/2011   Procedure: CESAREAN SECTION;  Surgeon: Bing Plume, MD;  Location: [facility];  Service: Gynecology;  Laterality: N/A;  Primary cesarean section with delivery of baby girl at  1. Apgars 9/10.  Marland Kitchen CESAREAN SECTION N/A 02/03/2017   Procedure: CESAREAN SECTION;  Surgeon: Huel Cote, MD;  Location: [facility] BIRTHING SU[facility];  Service: Obstetrics;  Laterality: N/A;  . MOUTH SURGERY      Social History   Tobacco Use  . Smoking status: Never Smoker  . Smokeless tobacco: Never Used  Substance Use Topics  . Alcohol use: No    Family History  Problem Relation Age of Onset  . Arthritis Mother   . Diabetes Father   . Heart disease Paternal Grandmother   . Hypertension Paternal Grandmother   . Diabetes Paternal Grandmother   . Arthritis Paternal Grandmother   .  Diabetes Paternal Grandfather     Review of Systems  Constitutional: Negative for chills, fever and malaise/fatigue.  Respiratory: Negative for cough, shortness of breath and wheezing.   Cardiovascular: Negative for chest pain, palpitations and leg swelling.  Gastrointestinal: Negative for abdominal pain, constipation, diarrhea, heartburn, nausea and vomiting.  Genitourinary: Negative for dysuria, frequency, hematuria and urgency.  Musculoskeletal: Negative for back pain and joint pain.  Skin: Negative for itching and rash.  Neurological: Negative for dizziness, weakness and headaches.     OBJECTIVE:  Today's Vitals   10/04/20 0922  BP: 104/68  Pulse: 87  Temp: 98.4 F (36.9 C)  SpO2: 100%  Weight: 122 lb (55.3 kg)  Height: 5\' 2"   (1.575 m)   Body mass index is 22.31 kg/m.   Physical Exam Constitutional:      General: She is not in acute distress.    Appearance: Normal appearance. She is not ill-appearing.  HENT:     Head: Normocephalic.  Cardiovascular:     Rate and Rhythm: Normal rate and regular rhythm.     Pulses: Normal pulses.     Heart sounds: Normal heart sounds. No murmur heard. No friction rub. No gallop.   Pulmonary:     Effort: Pulmonary effort is normal. No respiratory distress.     Breath sounds: Normal breath sounds. No stridor. No wheezing, rhonchi or rales.  Abdominal:     General: Bowel sounds are normal.     Palpations: Abdomen is soft.     Tenderness: There is no abdominal tenderness.  Musculoskeletal:     Right lower leg: No edema.     Left lower leg: No edema.  Skin:    General: Skin is warm and dry.  Neurological:     Mental Status: She is alert and oriented to person, place, and time.  Psychiatric:        Mood and Affect: Mood normal.        Behavior: Behavior normal.   Pelvic exam: normal external genitalia, vulva, vagina, cervix, uterus and adnexa, VULVA: normal appearing vulva with no masses, tenderness or lesions, VAGINA: normal appearing vagina with normal color and discharge, no lesions, CERVIX: normal appearing cervix without lesions, cervical discharge present - white, odorless and thick, UTERUS: uterus is normal size, shape, consistency and nontender, ADNEXA: normal adnexa in size, nontender and no masses, PAP: Pap smear done today, thin-prep method, DNA probe for chlamydia and GC obtained, HPV test, exam chaperoned by [facility], LPN.  No results found for this or any previous visit (from the past 24 hour(s)).  No results found.   ASSESSMENT and PLAN  Problem List Items Addressed This Visit   None   Visit Diagnoses    Pap smear for cervical cancer screening    -  Primary   Relevant Orders   Cytology - PAP([place])      Plan . Will follow up with lab  results   Return if symptoms worsen or fail to improve.    [facility] Rejina Odle, FNP-BC Primary Care at [facility] [address] [place], [place] Ph.  [phone] Fax [phone]

## 2020-10-04 NOTE — Patient Instructions (Addendum)
 Pap Test Why am I having this test? A Pap test, also called a Pap smear, is a screening test to check for signs of:  Cancer of the vagina, cervix, and uterus. The cervix is the lower part of the uterus that opens into the vagina.  Infection.  Changes that may be a sign that cancer is developing (precancerous changes). Women need this test on a regular basis. In general, you should have a Pap test every 3 years until you reach menopause or age 32. Women aged 30-60 may choose to have their Pap test done at the same time as an HPV (human papillomavirus) test every 5 years (instead of every 3 years). Your health care provider may recommend having Pap tests more or less often depending on your medical conditions and past Pap test results. What kind of sample is taken? Your health care provider will collect a sample of cells from the surface of your cervix. This will be done using a small cotton swab, plastic spatula, or brush. This sample is often collected during a pelvic exam, when you are lying on your back on an exam table with feet in footrests (stirrups). In some cases, fluids (secretions) from the cervix or vagina may also be collected.   How do I prepare for this test?  Be aware of where you are in your menstrual cycle. If you are menstruating on the day of the test, you may be asked to reschedule.  You may need to reschedule if you have a known vaginal infection on the day of the test.  Follow instructions from your health care provider about: ? Changing or stopping your regular medicines. Some medicines can cause abnormal test results, such as digitalis and tetracycline. ? Avoiding douching or taking a bath the day before or the day of the test. Tell a health care provider about:  Any allergies you have.  All medicines you are taking, including vitamins, herbs, eye drops, creams, and over-the-counter medicines.  Any blood disorders you have.  Any surgeries you have had.  Any  medical conditions you have.  Whether you are pregnant or may be pregnant. How are the results reported? Your test results will be reported as either abnormal or normal. A false-positive result can occur. A false positive is incorrect because it means that a condition is present when it is not. A false-negative result can occur. A false negative is incorrect because it means that a condition is not present when it is. What do the results mean? A normal test result means that you do not have signs of cancer of the vagina, cervix, or uterus. An abnormal result may mean that you have:  Cancer. A Pap test by itself is not enough to diagnose cancer. You will have more tests done in this case.  Precancerous changes in your vagina, cervix, or uterus.  Inflammation of the cervix.  An STD (sexually transmitted disease).  A fungal infection.  A parasite infection. Talk with your health care provider about what your results mean. Questions to ask your health care provider Ask your health care provider, or the department that is doing the test:  When will my results be ready?  How will I get my results?  What are my treatment options?  What other tests do I need?  What are my next steps? Summary  In general, women should have a Pap test every 3 years until they reach menopause or age 32.  Your health care provider will   collect a sample of cells from the surface of your cervix. This will be done using a small cotton swab, plastic spatula, or brush.  In some cases, fluids (secretions) from the cervix or vagina may also be collected. This information is not intended to replace advice given to you by your health care provider. Make sure you discuss any questions you have with your health care provider. Document Revised: 03/08/2020 Document Reviewed: 03/03/2020 Elsevier Patient Education  2021 [employer]    If you have lab work done today you will be contacted with your lab results  within the next 2 weeks.  If you have not heard from us then please contact us. The fastest way to get your results is to register for My Chart.   IF you received an x-ray today, you will receive an invoice from [facility]. Please contact [facility] at [phone] with questions or concerns regarding your invoice.   IF you received labwork today, you will receive an invoice from [facility]. Please contact [facility] at [phone] with questions or concerns regarding your invoice.   Our billing staff will not be able to assist you with questions regarding bills from these companies.  You will be contacted with the lab results as soon as they are available. The fastest way to get your results is to activate your My Chart account. Instructions are located on the last page of this paperwork. If you have not heard from us regarding the results in 2 weeks, please contact this office.      

## 2020-10-12 LAB — CYTOLOGY - PAP
Chlamydia: NEGATIVE
Comment: NEGATIVE
Comment: NEGATIVE
Comment: NEGATIVE
Comment: NEGATIVE
Comment: NORMAL
Diagnosis: NEGATIVE
HSV1: NEGATIVE
HSV2: NEGATIVE
High risk HPV: NEGATIVE
Neisseria Gonorrhea: NEGATIVE
Trichomonas: NEGATIVE

## 2020-10-22 ENCOUNTER — Other Ambulatory Visit: Payer: Self-pay | Admitting: Family Medicine

## 2020-10-22 DIAGNOSIS — F411 Generalized anxiety disorder: Secondary | ICD-10-CM
